# Patient Record
Sex: Female | Born: 1985 | Race: Black or African American | Hispanic: No | Marital: Married | Smoking: Never smoker
Health system: Southern US, Community
[De-identification: ages and names within clinical notes are randomized; demographics above are authoritative.]

## PROBLEM LIST (undated history)

## (undated) ENCOUNTER — Inpatient Hospital Stay (HOSPITAL_COMMUNITY): Payer: Self-pay

## (undated) ENCOUNTER — Ambulatory Visit (HOSPITAL_COMMUNITY): Source: Home / Self Care

## (undated) DIAGNOSIS — D649 Anemia, unspecified: Secondary | ICD-10-CM

## (undated) HISTORY — PX: NO PAST SURGERIES: SHX2092

---
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
## 2005-01-27 ENCOUNTER — Emergency Department (HOSPITAL_COMMUNITY): Admission: EM | Admit: 2005-01-27 | Discharge: 2005-01-28 | Payer: Self-pay | Admitting: Emergency Medicine

## 2005-06-10 ENCOUNTER — Inpatient Hospital Stay (HOSPITAL_COMMUNITY): Admission: AD | Admit: 2005-06-10 | Discharge: 2005-06-10 | Payer: Self-pay | Admitting: Obstetrics and Gynecology

## 2005-08-18 ENCOUNTER — Emergency Department (HOSPITAL_COMMUNITY): Admission: EM | Admit: 2005-08-18 | Discharge: 2005-08-18 | Payer: Self-pay | Admitting: Emergency Medicine

## 2005-09-22 ENCOUNTER — Emergency Department (HOSPITAL_COMMUNITY): Admission: EM | Admit: 2005-09-22 | Discharge: 2005-09-22 | Payer: Self-pay | Admitting: Family Medicine

## 2005-12-19 ENCOUNTER — Emergency Department (HOSPITAL_COMMUNITY): Admission: EM | Admit: 2005-12-19 | Discharge: 2005-12-19 | Payer: Self-pay | Admitting: Emergency Medicine

## 2006-01-17 ENCOUNTER — Emergency Department (HOSPITAL_COMMUNITY): Admission: EM | Admit: 2006-01-17 | Discharge: 2006-01-17 | Payer: Self-pay | Admitting: Emergency Medicine

## 2006-02-23 ENCOUNTER — Emergency Department (HOSPITAL_COMMUNITY): Admission: EM | Admit: 2006-02-23 | Discharge: 2006-02-23 | Payer: Self-pay | Admitting: Family Medicine

## 2006-04-07 ENCOUNTER — Emergency Department (HOSPITAL_COMMUNITY): Admission: EM | Admit: 2006-04-07 | Discharge: 2006-04-07 | Payer: Self-pay | Admitting: Family Medicine

## 2006-05-22 ENCOUNTER — Emergency Department (HOSPITAL_COMMUNITY): Admission: EM | Admit: 2006-05-22 | Discharge: 2006-05-22 | Payer: Self-pay | Admitting: Family Medicine

## 2006-07-11 ENCOUNTER — Emergency Department (HOSPITAL_COMMUNITY): Admission: EM | Admit: 2006-07-11 | Discharge: 2006-07-11 | Payer: Self-pay | Admitting: Family Medicine

## 2006-07-20 ENCOUNTER — Inpatient Hospital Stay (HOSPITAL_COMMUNITY): Admission: AD | Admit: 2006-07-20 | Discharge: 2006-07-20 | Payer: Self-pay | Admitting: Obstetrics and Gynecology

## 2006-08-31 ENCOUNTER — Emergency Department (HOSPITAL_COMMUNITY): Admission: EM | Admit: 2006-08-31 | Discharge: 2006-08-31 | Payer: Self-pay | Admitting: Family Medicine

## 2006-11-28 ENCOUNTER — Emergency Department (HOSPITAL_COMMUNITY): Admission: EM | Admit: 2006-11-28 | Discharge: 2006-11-28 | Payer: Self-pay | Admitting: Emergency Medicine

## 2007-03-14 ENCOUNTER — Emergency Department (HOSPITAL_COMMUNITY): Admission: EM | Admit: 2007-03-14 | Discharge: 2007-03-14 | Payer: Self-pay | Admitting: Family Medicine

## 2007-08-27 ENCOUNTER — Emergency Department (HOSPITAL_COMMUNITY): Admission: EM | Admit: 2007-08-27 | Discharge: 2007-08-27 | Payer: Self-pay | Admitting: Emergency Medicine

## 2007-09-24 ENCOUNTER — Emergency Department (HOSPITAL_COMMUNITY): Admission: EM | Admit: 2007-09-24 | Discharge: 2007-09-24 | Payer: Self-pay | Admitting: Family Medicine

## 2007-11-18 ENCOUNTER — Emergency Department (HOSPITAL_COMMUNITY): Admission: EM | Admit: 2007-11-18 | Discharge: 2007-11-18 | Payer: Self-pay | Admitting: Family Medicine

## 2008-01-06 ENCOUNTER — Emergency Department (HOSPITAL_COMMUNITY): Admission: EM | Admit: 2008-01-06 | Discharge: 2008-01-06 | Payer: Self-pay | Admitting: Emergency Medicine

## 2008-02-15 ENCOUNTER — Inpatient Hospital Stay (HOSPITAL_COMMUNITY): Admission: AD | Admit: 2008-02-15 | Discharge: 2008-02-15 | Payer: Self-pay | Admitting: Obstetrics & Gynecology

## 2008-02-16 ENCOUNTER — Inpatient Hospital Stay (HOSPITAL_COMMUNITY): Admission: AD | Admit: 2008-02-16 | Discharge: 2008-02-16 | Payer: Self-pay | Admitting: Obstetrics & Gynecology

## 2008-02-19 ENCOUNTER — Inpatient Hospital Stay (HOSPITAL_COMMUNITY): Admission: AD | Admit: 2008-02-19 | Discharge: 2008-02-19 | Payer: Self-pay | Admitting: Obstetrics & Gynecology

## 2008-02-22 ENCOUNTER — Inpatient Hospital Stay (HOSPITAL_COMMUNITY): Admission: AD | Admit: 2008-02-22 | Discharge: 2008-02-22 | Payer: Self-pay | Admitting: Obstetrics & Gynecology

## 2008-02-29 ENCOUNTER — Inpatient Hospital Stay (HOSPITAL_COMMUNITY): Admission: AD | Admit: 2008-02-29 | Discharge: 2008-02-29 | Payer: Self-pay | Admitting: Obstetrics & Gynecology

## 2008-03-07 ENCOUNTER — Inpatient Hospital Stay (HOSPITAL_COMMUNITY): Admission: AD | Admit: 2008-03-07 | Discharge: 2008-03-07 | Payer: Self-pay | Admitting: Obstetrics & Gynecology

## 2008-04-10 ENCOUNTER — Emergency Department (HOSPITAL_COMMUNITY): Admission: EM | Admit: 2008-04-10 | Discharge: 2008-04-10 | Payer: Self-pay | Admitting: Emergency Medicine

## 2008-07-04 ENCOUNTER — Inpatient Hospital Stay (HOSPITAL_COMMUNITY): Admission: AD | Admit: 2008-07-04 | Discharge: 2008-07-04 | Payer: Self-pay | Admitting: Obstetrics & Gynecology

## 2008-09-07 ENCOUNTER — Inpatient Hospital Stay (HOSPITAL_COMMUNITY): Admission: AD | Admit: 2008-09-07 | Discharge: 2008-09-08 | Payer: Self-pay | Admitting: Obstetrics & Gynecology

## 2008-09-10 ENCOUNTER — Inpatient Hospital Stay (HOSPITAL_COMMUNITY): Admission: AD | Admit: 2008-09-10 | Discharge: 2008-09-10 | Payer: Self-pay | Admitting: Obstetrics & Gynecology

## 2008-09-15 ENCOUNTER — Inpatient Hospital Stay (HOSPITAL_COMMUNITY): Admission: AD | Admit: 2008-09-15 | Discharge: 2008-09-15 | Payer: Self-pay | Admitting: Obstetrics & Gynecology

## 2008-09-16 ENCOUNTER — Inpatient Hospital Stay (HOSPITAL_COMMUNITY): Admission: AD | Admit: 2008-09-16 | Discharge: 2008-09-17 | Payer: Self-pay | Admitting: Obstetrics and Gynecology

## 2008-09-19 ENCOUNTER — Inpatient Hospital Stay (HOSPITAL_COMMUNITY): Admission: AD | Admit: 2008-09-19 | Discharge: 2008-09-19 | Payer: Self-pay | Admitting: Obstetrics & Gynecology

## 2008-09-24 ENCOUNTER — Inpatient Hospital Stay (HOSPITAL_COMMUNITY): Admission: AD | Admit: 2008-09-24 | Discharge: 2008-09-24 | Payer: Self-pay | Admitting: Obstetrics & Gynecology

## 2008-10-01 ENCOUNTER — Inpatient Hospital Stay (HOSPITAL_COMMUNITY): Admission: AD | Admit: 2008-10-01 | Discharge: 2008-10-01 | Payer: Self-pay | Admitting: Obstetrics & Gynecology

## 2008-12-25 ENCOUNTER — Ambulatory Visit (HOSPITAL_COMMUNITY): Admission: RE | Admit: 2008-12-25 | Discharge: 2008-12-25 | Payer: Self-pay | Admitting: Obstetrics

## 2009-02-06 ENCOUNTER — Inpatient Hospital Stay (HOSPITAL_COMMUNITY): Admission: AD | Admit: 2009-02-06 | Discharge: 2009-02-06 | Payer: Self-pay | Admitting: Obstetrics

## 2009-03-07 ENCOUNTER — Inpatient Hospital Stay (HOSPITAL_COMMUNITY): Admission: AD | Admit: 2009-03-07 | Discharge: 2009-03-07 | Payer: Self-pay | Admitting: Obstetrics

## 2009-03-21 ENCOUNTER — Ambulatory Visit (HOSPITAL_COMMUNITY): Admission: RE | Admit: 2009-03-21 | Discharge: 2009-03-21 | Payer: Self-pay | Admitting: Obstetrics

## 2009-06-01 ENCOUNTER — Ambulatory Visit: Payer: Self-pay | Admitting: Family

## 2009-06-01 ENCOUNTER — Inpatient Hospital Stay (HOSPITAL_COMMUNITY): Admission: AD | Admit: 2009-06-01 | Discharge: 2009-06-01 | Payer: Self-pay | Admitting: Obstetrics

## 2009-08-01 ENCOUNTER — Inpatient Hospital Stay (HOSPITAL_COMMUNITY): Admission: AD | Admit: 2009-08-01 | Discharge: 2009-08-02 | Payer: Self-pay | Admitting: Obstetrics

## 2009-08-04 ENCOUNTER — Inpatient Hospital Stay (HOSPITAL_COMMUNITY): Admission: AD | Admit: 2009-08-04 | Discharge: 2009-08-06 | Payer: Self-pay | Admitting: Obstetrics

## 2009-10-07 ENCOUNTER — Emergency Department (HOSPITAL_COMMUNITY): Admission: EM | Admit: 2009-10-07 | Discharge: 2009-10-07 | Payer: Self-pay | Admitting: Family Medicine

## 2010-01-28 ENCOUNTER — Emergency Department (HOSPITAL_COMMUNITY)
Admission: EM | Admit: 2010-01-28 | Discharge: 2010-01-28 | Payer: Self-pay | Source: Home / Self Care | Admitting: Emergency Medicine

## 2010-03-24 ENCOUNTER — Encounter: Payer: Self-pay | Admitting: Obstetrics

## 2010-05-17 LAB — POCT URINALYSIS DIPSTICK
Bilirubin Urine: NEGATIVE
Glucose, UA: NEGATIVE mg/dL

## 2010-05-17 LAB — GC/CHLAMYDIA PROBE AMP, GENITAL
Chlamydia, DNA Probe: NEGATIVE
GC Probe Amp, Genital: NEGATIVE

## 2010-05-18 LAB — URINALYSIS, ROUTINE W REFLEX MICROSCOPIC
Protein, ur: NEGATIVE mg/dL
Urobilinogen, UA: 0.2 mg/dL (ref 0.0–1.0)

## 2010-05-20 LAB — CBC
Hemoglobin: 11.4 g/dL — ABNORMAL LOW (ref 12.0–15.0)
MCV: 91.2 fL (ref 78.0–100.0)
Platelets: 182 10*3/uL (ref 150–400)
RBC: 3.72 MIL/uL — ABNORMAL LOW (ref 3.87–5.11)
RDW: 12.9 % (ref 11.5–15.5)
WBC: 8.5 10*3/uL (ref 4.0–10.5)

## 2010-05-20 LAB — RPR: RPR Ser Ql: NONREACTIVE

## 2010-06-04 LAB — URINALYSIS, ROUTINE W REFLEX MICROSCOPIC
Hgb urine dipstick: NEGATIVE
Specific Gravity, Urine: 1.025 (ref 1.005–1.030)
Urobilinogen, UA: 0.2 mg/dL (ref 0.0–1.0)

## 2010-06-04 LAB — WET PREP, GENITAL
Trich, Wet Prep: NONE SEEN
Yeast Wet Prep HPF POC: NONE SEEN

## 2010-06-08 LAB — HCG, QUANTITATIVE, PREGNANCY: hCG, Beta Chain, Quant, S: 5 m[IU]/mL — ABNORMAL HIGH (ref <5)

## 2010-06-09 LAB — URINALYSIS, ROUTINE W REFLEX MICROSCOPIC
Glucose, UA: NEGATIVE mg/dL
Ketones, ur: NEGATIVE mg/dL
Nitrite: NEGATIVE
Protein, ur: NEGATIVE mg/dL
Urobilinogen, UA: 0.2 mg/dL (ref 0.0–1.0)

## 2010-06-09 LAB — CREATININE, SERUM
Creatinine, Ser: 0.63 mg/dL (ref 0.4–1.2)
Creatinine, Ser: 0.6 mg/dL (ref 0.4–1.2)
GFR calc Af Amer: >60 mL/min (ref >60)
GFR calc non Af Amer: >60 mL/min (ref >60)

## 2010-06-09 LAB — WET PREP, GENITAL: Trich, Wet Prep: NONE SEEN

## 2010-06-09 LAB — DIFFERENTIAL
Band Neutrophils: 0 % (ref 0–10)
Basophils Absolute: 0 10*3/uL (ref 0.0–0.1)
Basophils Relative: 1 % (ref 0–1)
Eosinophils Relative: 5 % (ref 0–5)
Lymphocytes Relative: 34 % (ref 12–46)
Lymphs Abs: 2 10*3/uL (ref 0.7–4.0)
Monocytes Absolute: 0.5 10*3/uL (ref 0.1–1.0)
Monocytes Relative: 10 % (ref 3–12)
Neutro Abs: 3.3 10*3/uL (ref 1.7–7.7)
Neutro Abs: 2.7 10*3/uL (ref 1.7–7.7)
Neutrophils Relative %: 56 % (ref 43–77)

## 2010-06-09 LAB — HCG, QUANTITATIVE, PREGNANCY
hCG, Beta Chain, Quant, S: 44 m[IU]/mL — ABNORMAL HIGH (ref <5)
hCG, Beta Chain, Quant, S: 472 m[IU]/mL — ABNORMAL HIGH (ref <5)
hCG, Beta Chain, Quant, S: 809 m[IU]/mL — ABNORMAL HIGH (ref <5)
hCG, Beta Chain, Quant, S: 1018 m[IU]/mL — ABNORMAL HIGH (ref <5)

## 2010-06-09 LAB — CBC
Hemoglobin: 10.9 g/dL — ABNORMAL LOW (ref 12.0–15.0)
Hemoglobin: 10.8 g/dL — ABNORMAL LOW (ref 12.0–15.0)
MCHC: 34.1 g/dL (ref 30.0–36.0)
MCHC: 33.9 g/dL (ref 30.0–36.0)
MCHC: 34 g/dL (ref 30.0–36.0)
Platelets: 138 10*3/uL — ABNORMAL LOW (ref 150–400)
RBC: 3.7 MIL/uL — ABNORMAL LOW (ref 3.87–5.11)
RBC: 3.7 MIL/uL — ABNORMAL LOW (ref 3.87–5.11)
RDW: 12.6 % (ref 11.5–15.5)
RDW: 12.8 % (ref 11.5–15.5)
WBC: 5.9 10*3/uL (ref 4.0–10.5)

## 2010-06-09 LAB — ABO/RH: ABO/RH(D): B POS

## 2010-06-09 LAB — AST: AST: 25 U/L (ref 0–37)

## 2010-06-09 LAB — BUN: BUN: 14 mg/dL (ref 6–23)

## 2010-06-11 LAB — URINALYSIS, ROUTINE W REFLEX MICROSCOPIC
Bilirubin Urine: NEGATIVE
Ketones, ur: NEGATIVE mg/dL
Nitrite: NEGATIVE
Protein, ur: NEGATIVE mg/dL
Urobilinogen, UA: 0.2 mg/dL (ref 0.0–1.0)

## 2010-06-11 LAB — WET PREP, GENITAL

## 2010-06-17 LAB — HCG, QUANTITATIVE, PREGNANCY: hCG, Beta Chain, Quant, S: 214 m[IU]/mL — ABNORMAL HIGH (ref <5)

## 2010-07-19 NOTE — H&P (Signed)
[facility]  Patient:    RIKA, DAUGHDRILL Visit Number: [id number] MRN: [id number]          Service Type: OBS Location: [id number] Attending Physician:  Shaune Spittle Dictated by:   Nigel Bridgeman, C.N.M. Admit Date:  08/26/2001                           History and Physical  HISTORY OF PRESENT ILLNESS:   Ms. Jimmey Ralph is a 25 year old gravida 1, para 0, at 39-5/7 weeks, who presents today with uterine contractions every three minutes.  The patient was seen in maternity admissions yesterday and was monitored a good part of the day.  Her cervix at that time was 1-2 with no change after prolonged evaluation.  The patient was sent home from maternity admissions.  She did continue to have contractions during the night and presents today with increasing strength and quality of the contractions.  She did have some bloody show last night as well.  She reports positive fetal movement.  The pregnancy has been remarkable for:  (1) Age 25; (2) questionable dates; (3) anemia; (4) late to care.  PRENATAL LABORATORY DATA:     Blood type is B positive.  Rh antibody negative. VDRL nonreactive.  Rubella titer positive.  Hepatitis B surface antigen negative.  HIV nonreactive.  Sickle cell test negative.  Pap was normal. Glucose challenge was normal.  GC and Chlamydia cultures were negative.  AFP was not done secondary to patient late to care.  Group B strep culture was negative at 36 weeks.  EDC of August 28, 2001, was established by ultrasound at 22 weeks.  HISTORY OF PRESENT PREGNANCY:  The patient entered care thinking she was approximately 13 weeks but felt like she was further along based upon size greater than dates.  She had an ultrasound that put her at approximately 22 weeks on initiation of care.  She had another ultrasound at 30 weeks, which showed normal growth.  This was done secondary to measuring size less than dates.  She was having some  contractions at that time and was sent to maternity admissions for monitoring, terbutaline, and IV fluids.  The rest of her pregnancy was uncomplicated until yesterday, when she began to have prolonged prodromal labor symptoms.  OBSTETRICAL HISTORY:          The patient is a primigravida.  PAST MEDICAL HISTORY:         The patient is a previous condom user but inconsistent use.  She has occasional bacterial infections.  She reports the usual childhood illnesses.  She has a history of anemia.  ALLERGIES:                    The patient has no known medication allergies.  FAMILY HISTORY:               The patients maternal grandmother had some type of heart problems.  The patients mother and sister have hypertension.  Her maternal grandmother has varicosities.  Her maternal grandmother also has diabetes and has kidney disease.  Her sister does use marijuana and alcohol.  GENETIC HISTORY:              Unremarkable.  SOCIAL HISTORY:               The patient is single.  The father of the baby is not involved.  The patient is an  eighth grade student.  She is African-American.  She lives with her mother.  She denies any alcohol, drug, or tobacco use during this pregnancy.  PHYSICAL EXAMINATION:  VITAL SIGNS:                  Stable.  The patient is afebrile.  HEENT:                        Within normal limits.  CHEST:                        Bilateral breath sounds are clear.  CARDIAC:                      Regular rate and rhythm without murmur.  BREASTS:                      Soft and nontender.  ABDOMEN:                      Fundal height is approximately 38 cm.  Estimated fetal weight is 7 to 7-1/2 pounds.  Uterine contractions are every three minutes, moderate to strong quality.  PELVIC:                       Cervical exam 3+, 90%, vertex at a -1 station with bulging bag of water.  Fetal heart rate is in the 150s by Doppler.  EXTREMITIES:                  Deep tendon reflexes  are 2+ without clonus. There is a trace to 1+ edema noted in the lower extremities.  IMPRESSION:                   1. Intrauterine pregnancy at 39-5/7 weeks.                               25. [age]-year-old patient.                               3. Prolonged prodromal labor with now early                                  labor.  PLAN:                         1. Admit to birthing suite per consult with                                  Dr. Dierdre Forth as attending physician.                               2. Routine certified nurse midwife orders.                               3. Reviewed options for pain medication, i.e.,  IV pain medication or epidural, with patient                                  and her mother.  They will decide as labor                                  progresses. Dictated by:   Nigel Bridgeman, C.N.M. Attending Physician:  Shaune Spittle DD:  08/26/01 TD:  08/26/01 Job: [id number]

## 2010-11-20 LAB — POCT URINALYSIS DIP (DEVICE)
Glucose, UA: NEGATIVE
Hgb urine dipstick: NEGATIVE
Nitrite: NEGATIVE
Urobilinogen, UA: 2 — ABNORMAL HIGH
pH: 6.5

## 2010-11-20 LAB — POCT PREGNANCY, URINE
Operator id: 235561
Preg Test, Ur: NEGATIVE

## 2010-11-20 LAB — WET PREP, GENITAL: WBC, Wet Prep HPF POC: NONE SEEN

## 2010-11-28 LAB — POCT URINALYSIS DIP (DEVICE)
Glucose, UA: NEGATIVE
Nitrite: NEGATIVE
Operator id: 239701
Urobilinogen, UA: 0.2

## 2010-11-28 LAB — WET PREP, GENITAL
Trich, Wet Prep: NONE SEEN
Yeast Wet Prep HPF POC: NONE SEEN

## 2010-11-28 LAB — POCT PREGNANCY, URINE: Preg Test, Ur: NEGATIVE

## 2010-11-29 LAB — POCT URINALYSIS DIP (DEVICE)
Glucose, UA: NEGATIVE
Ketones, ur: 15 — AB
Operator id: 247071
Specific Gravity, Urine: >=1.03
Urobilinogen, UA: 0.2

## 2010-11-29 LAB — GC/CHLAMYDIA PROBE AMP, GENITAL
Chlamydia, DNA Probe: NEGATIVE
GC Probe Amp, Genital: NEGATIVE

## 2010-11-29 LAB — WET PREP, GENITAL

## 2010-12-02 LAB — POCT URINALYSIS DIP (DEVICE)
Bilirubin Urine: NEGATIVE
Ketones, ur: 15 — AB
Nitrite: NEGATIVE
Operator id: 239701
Protein, ur: NEGATIVE
pH: 6.5

## 2010-12-02 LAB — WET PREP, GENITAL: Yeast Wet Prep HPF POC: NONE SEEN

## 2010-12-02 LAB — POCT PREGNANCY, URINE: Preg Test, Ur: NEGATIVE

## 2010-12-02 LAB — GC/CHLAMYDIA PROBE AMP, GENITAL: GC Probe Amp, Genital: NEGATIVE

## 2010-12-03 LAB — POCT PREGNANCY, URINE: Preg Test, Ur: NEGATIVE

## 2010-12-03 LAB — POCT URINALYSIS DIP (DEVICE)
Ketones, ur: >=160 mg/dL — AB
Operator id: 239701
Specific Gravity, Urine: 1.025 (ref 1.005–1.030)

## 2010-12-05 LAB — GC/CHLAMYDIA PROBE AMP, GENITAL
Chlamydia, DNA Probe: NEGATIVE
GC Probe Amp, Genital: NEGATIVE

## 2010-12-05 LAB — URINALYSIS, ROUTINE W REFLEX MICROSCOPIC
Glucose, UA: NEGATIVE mg/dL
Nitrite: NEGATIVE
Specific Gravity, Urine: 1.02 (ref 1.005–1.030)
pH: 6 (ref 5.0–8.0)

## 2010-12-05 LAB — CREATININE, SERUM
Creatinine, Ser: 0.63 mg/dL (ref 0.4–1.2)
GFR calc Af Amer: >60 mL/min (ref >60)
GFR calc non Af Amer: >60 mL/min (ref >60)

## 2010-12-05 LAB — DIFFERENTIAL
Basophils Absolute: 0.1 10*3/uL (ref 0.0–0.1)
Basophils Relative: 1 % (ref 0–1)
Eosinophils Absolute: 0.1 10*3/uL (ref 0.0–0.7)
Monocytes Absolute: 0.3 10*3/uL (ref 0.1–1.0)
Neutro Abs: 3.6 10*3/uL (ref 1.7–7.7)
Neutrophils Relative %: 74 % (ref 43–77)

## 2010-12-05 LAB — BUN: BUN: 8 mg/dL (ref 6–23)

## 2010-12-05 LAB — POCT PREGNANCY, URINE: Preg Test, Ur: POSITIVE

## 2010-12-05 LAB — CBC
Hemoglobin: 11.6 g/dL — ABNORMAL LOW (ref 12.0–15.0)
RBC: 3.94 MIL/uL (ref 3.87–5.11)
RDW: 12.6 % (ref 11.5–15.5)
WBC: 4.9 10*3/uL (ref 4.0–10.5)

## 2010-12-05 LAB — WET PREP, GENITAL

## 2010-12-05 LAB — HCG, QUANTITATIVE, PREGNANCY: hCG, Beta Chain, Quant, S: 2362 m[IU]/mL — ABNORMAL HIGH (ref <5)

## 2010-12-05 LAB — AST: AST: 18 U/L (ref 0–37)

## 2010-12-12 LAB — POCT URINALYSIS DIP (DEVICE)
Bilirubin Urine: NEGATIVE
Glucose, UA: NEGATIVE
Hgb urine dipstick: NEGATIVE
Specific Gravity, Urine: 1.02

## 2010-12-12 LAB — POCT PREGNANCY, URINE: Operator id: 239701

## 2010-12-18 LAB — POCT URINALYSIS DIP (DEVICE)
Bilirubin Urine: NEGATIVE
Glucose, UA: NEGATIVE
Hgb urine dipstick: NEGATIVE
Ketones, ur: NEGATIVE
Nitrite: NEGATIVE
Operator id: 239701
Protein, ur: NEGATIVE
Specific Gravity, Urine: 1.025
Urobilinogen, UA: 2 — ABNORMAL HIGH
pH: 7

## 2010-12-18 LAB — POCT PREGNANCY, URINE: Preg Test, Ur: NEGATIVE

## 2010-12-18 LAB — GC/CHLAMYDIA PROBE AMP, GENITAL
Chlamydia, DNA Probe: NEGATIVE
GC Probe Amp, Genital: NEGATIVE

## 2010-12-18 LAB — WET PREP, GENITAL

## 2011-12-17 ENCOUNTER — Encounter (HOSPITAL_COMMUNITY): Payer: Self-pay

## 2011-12-17 ENCOUNTER — Emergency Department (INDEPENDENT_AMBULATORY_CARE_PROVIDER_SITE_OTHER)
Admission: EM | Admit: 2011-12-17 | Discharge: 2011-12-17 | Disposition: A | Discharge status: Home / Self Care | Payer: Self-pay | Source: Home / Self Care

## 2011-12-17 DIAGNOSIS — B86 Scabies: Secondary | ICD-10-CM

## 2011-12-17 MED ORDER — PERMETHRIN 5 % EX CREA: TOPICAL_CREAM | Freq: Once | CUTANEOUS | Status: DC

## 2011-12-17 MED ORDER — PERMETHRIN 5 % EX CREA: TOPICAL_CREAM | CUTANEOUS | Status: DC

## 2011-12-17 NOTE — ED Provider Notes (Signed)
History     CSN: [id number]  Arrival date & time 12/17/11  0843   None     Chief Complaint  Patient presents with  . Rash    (Consider location/radiation/quality/duration/timing/severity/associated sxs/prior treatment) HPI Comments: 26 year old female complains of itchy skin for some weeks. She is unable to tell me just exactly how long period. The papules are located primarily on her hands and feet and to the distal extremities. There are also a few bumps with scratch marks on her torso. She knows her small child has been itching and scratching for several weeks as well. She denies ill feelings, fever, malaise or other symptoms of sickness.   History reviewed. No pertinent past medical history.  History reviewed. No pertinent past surgical history.  No family history on file.  History  Substance Use Topics  . Smoking status: Never Smoker   . Smokeless tobacco: Not on file  . Alcohol Use: No    OB History    Grav Para Term Preterm Abortions TAB SAB Ect Mult Living                  Review of Systems  Constitutional: Negative for fever, chills, activity change and fatigue.  HENT: Negative.   Respiratory: Negative.   Cardiovascular: Negative.   Skin: Positive for rash.  Neurological: Negative.     Allergies  Review of patient's allergies indicates no known allergies.  Home Medications   Current Outpatient Rx  Name Route Sig Dispense Refill  . PERMETHRIN 5 % EX CREA  Apply to affected area once. Repeat in 1 week. 60 g 1    BP 110/71  Pulse 66  Temp 98.2 F (36.8 C) (Oral)  Resp 16  SpO2 100%  LMP 11/22/2011  Physical Exam  Constitutional: She is oriented to person, place, and time. She appears well-developed and well-nourished. No distress.  Eyes: Conjunctivae normal and EOM are normal.  Neck: Normal range of motion. Neck supple.  Pulmonary/Chest: Effort normal.  Musculoskeletal: Normal range of motion. She exhibits no edema and no tenderness.    Neurological: She is alert and oriented to person, place, and time. No cranial nerve deficit.  Skin: Rash noted.  Psychiatric: She has a normal mood and affect.    ED Course  Procedures (including critical care time)  Labs Reviewed - No data to display No results found.   1. Scabies       MDM  Elimite applied from chin to toes and leave on for 8 hours. Then shower off. Repeat in one week. Instructions to clean wound and close and other infested articles at home. Have other members of the family treated, may take child to pediatrician. Dr. Lorenz Coaster requested I write a Rx Elemite q.s. For the entire family.         Hayden Rasmussen, NP 12/17/11 1020  Hayden Rasmussen, NP 12/17/11 1055

## 2011-12-17 NOTE — ED Notes (Signed)
Rash all over body x 1 week

## 2012-01-14 NOTE — ED Provider Notes (Signed)
Medical screening examination/treatment/procedure(s) were performed by non-physician practitioner and as supervising physician I was immediately available for consultation/collaboration.  Leslee Home, M.D.   Reuben Likes, MD 01/14/12 [phone]

## 2012-01-15 ENCOUNTER — Emergency Department (INDEPENDENT_AMBULATORY_CARE_PROVIDER_SITE_OTHER)
Admission: EM | Admit: 2012-01-15 | Discharge: 2012-01-15 | Disposition: A | Discharge status: Home / Self Care | Payer: Self-pay | Source: Home / Self Care | Attending: Family Medicine | Admitting: Family Medicine

## 2012-01-15 ENCOUNTER — Encounter (HOSPITAL_COMMUNITY): Payer: Self-pay | Admitting: *Deleted

## 2012-01-15 DIAGNOSIS — B3731 Acute candidiasis of vulva and vagina: Secondary | ICD-10-CM

## 2012-01-15 DIAGNOSIS — B373 Candidiasis of vulva and vagina: Secondary | ICD-10-CM

## 2012-01-15 MED ORDER — TERCONAZOLE 80 MG VA SUPP: 80.0000 mg | Freq: Every day | VAGINAL | Status: DC

## 2012-01-15 MED ORDER — FLUCONAZOLE 150 MG PO TABS: 150.0000 mg | ORAL_TABLET | Freq: Once | ORAL | Status: DC

## 2012-01-15 NOTE — ED Provider Notes (Signed)
History     CSN: [id number]  Arrival date & time 01/15/12  1907   First MD Initiated Contact with Patient 01/15/12 1910      No chief complaint on file.   (Consider location/radiation/quality/duration/timing/severity/associated sxs/prior treatment) Patient is a 26 y.o. female presenting with vaginal itching.  Vaginal Itching This is a new problem. The current episode started 2 days ago. Pertinent negatives include no abdominal pain.    No past medical history on file.  No past surgical history on file.  No family history on file.  History  Substance Use Topics  . Smoking status: Never Smoker   . Smokeless tobacco: Not on file  . Alcohol Use: No    OB History    Grav Para Term Preterm Abortions TAB SAB Ect Mult Living                  Review of Systems  Constitutional: Negative.   Gastrointestinal: Negative for abdominal pain.  Genitourinary: Positive for vaginal discharge. Negative for dysuria, frequency, vaginal bleeding, vaginal pain, menstrual problem and pelvic pain.    Allergies  Review of patient's allergies indicates no known allergies.  Home Medications   Current Outpatient Rx  Name  Route  Sig  Dispense  Refill  . FLUCONAZOLE 150 MG PO TABS   Oral   Take 1 tablet (150 mg total) by mouth once. Repeat in 1 week.   1 tablet   1   . PERMETHRIN 5 % EX CREA      Apply to affected area once. Repeat in 1 week.   60 g   1   . PERMETHRIN 5 % EX CREA   Topical   Apply topically once. Apply from chin to toes and rinse in 8 hours   240 g   0   . TERCONAZOLE 80 MG VA SUPP   Vaginal   Place 1 suppository (80 mg total) vaginally at bedtime.   3 suppository   0     BP 96/71  Pulse 73  Temp 98.3 F (36.8 C) (Oral)  Resp 16  SpO2 100%  LMP 11/22/2011  Physical Exam  Nursing note and vitals reviewed. Constitutional: She is oriented to person, place, and time. She appears well-developed and well-nourished.  Abdominal: Soft. Bowel sounds are  normal. There is no tenderness. There is no rebound.  Neurological: She is alert and oriented to person, place, and time.  Skin: Skin is warm and dry.    ED Course  Procedures (including critical care time)  Labs Reviewed - No data to display No results found.   1. Yeast vaginitis       MDM          Linna Hoff, MD 01/15/12 2055

## 2012-01-15 NOTE — ED Notes (Signed)
C/o vaginal itching and discharge x 2 days.

## 2013-05-26 ENCOUNTER — Encounter (HOSPITAL_COMMUNITY): Payer: Self-pay | Admitting: Emergency Medicine

## 2013-05-26 ENCOUNTER — Emergency Department (INDEPENDENT_AMBULATORY_CARE_PROVIDER_SITE_OTHER)
Admission: EM | Admit: 2013-05-26 | Discharge: 2013-05-26 | Disposition: A | Discharge status: Home / Self Care | Payer: 59 | Source: Home / Self Care

## 2013-05-26 DIAGNOSIS — L0390 Cellulitis, unspecified: Secondary | ICD-10-CM

## 2013-05-26 DIAGNOSIS — L0291 Cutaneous abscess, unspecified: Secondary | ICD-10-CM

## 2013-05-26 LAB — POCT PREGNANCY, URINE: PREG TEST UR: NEGATIVE

## 2013-05-26 MED ORDER — SULFAMETHOXAZOLE-TMP DS 800-160 MG PO TABS: 1.0000 | ORAL_TABLET | Freq: Two times a day (BID) | ORAL | Status: DC

## 2013-05-26 NOTE — ED Notes (Signed)
Patient reports tattoo on right lower leg was done Monday( 3/23 ).  Reports a friend did this tattoo.  Currently colored area has blisters, redness, outside of tattoo lines has redness.  Patient reports leg is painful and currently working a job that requires her to stand nonstop

## 2013-05-26 NOTE — ED Provider Notes (Signed)
CSN: [id number]     Arrival date & time 05/26/13  1923 History   None    Chief Complaint  Patient presents with  . Cellulitis   (Consider location/radiation/quality/duration/timing/severity/associated sxs/prior Treatment) HPI Comments: 28 yo female comes in 3 days after getting tattoo on right lower extremity with redness/ swelling/ pain. She denies fever. She has been covering area with A & D ointment w/o improvement. She has never had problems with tattoos previously.    History reviewed. No pertinent past medical history. History reviewed. No pertinent past surgical history. No family history on file. History  Substance Use Topics  . Smoking status: Never Smoker   . Smokeless tobacco: Not on file  . Alcohol Use: No   OB History   Grav Para Term Preterm Abortions TAB SAB Ect Mult Living                 Review of Systems  Skin: Positive for color change and wound.  All other systems reviewed and are negative.    Allergies  Review of patient's allergies indicates no known allergies.  Home Medications   Current Outpatient Rx  Name  Route  Sig  Dispense  Refill  . fluconazole (DIFLUCAN) 150 MG tablet   Oral   Take 1 tablet (150 mg total) by mouth once. Repeat in 1 week.   1 tablet   1   . permethrin (ELIMITE) 5 % cream      Apply to affected area once. Repeat in 1 week.   60 g   1   . permethrin (ELIMITE) 5 % cream   Topical   Apply topically once. Apply from chin to toes and rinse in 8 hours   240 g   0   . sulfamethoxazole-trimethoprim (BACTRIM DS) 800-160 MG per tablet   Oral   Take 1 tablet by mouth 2 (two) times daily.   14 tablet   0   . terconazole (TERAZOL 3) 80 MG vaginal suppository   Vaginal   Place 1 suppository (80 mg total) vaginally at bedtime.   3 suppository   0    BP 108/71  Pulse 71  Temp(Src) 98.1 F (36.7 C) (Oral)  Resp 20  SpO2 98%  LMP 05/12/2013 Physical Exam  Nursing note and vitals reviewed. Constitutional: She  appears well-developed and well-nourished.  Skin: Skin is warm and dry. There is erythema.  Right lateral LE with area of erythema encompasses entire tattoo, with yellow crusting/ exudate present and mild edema on tattoo and approximately 1 inch outside of tattoo, mild tenderness    ED Course  Procedures (including critical care time) Labs Review Labs Reviewed  POCT PREGNANCY, URINE   Imaging Review No results found.   MDM   1. Cellulitis   Advised of wound hygiene, elevate leg w/c if SX increase or ER. Bactrim DS AD with food, OOW X 2 days.      Berenice PrimasMelissa R Kenzee Bassin, PA-C 05/26/13 2213

## 2013-05-26 NOTE — Discharge Instructions (Signed)
Cellulitis °Cellulitis is an infection of the skin and the tissue under the skin. The infected area is usually red and tender. This happens most often in the arms and lower legs. °HOME CARE  °· Take your antibiotic medicine as told. Finish the medicine even if you start to feel better. °· Keep the infected arm or leg raised (elevated). °· Put a warm cloth on the area up to 4 times per day. °· Only take medicines as told by your doctor. °· Keep all doctor visits as told. °GET HELP RIGHT AWAY IF:  °· You have a fever. °· You feel very sleepy. °· You throw up (vomit) or have watery poop (diarrhea). °· You feel sick and have muscle aches and pains. °· You see red streaks on the skin coming from the infected area. °· Your red area gets bigger or turns a dark color. °· Your bone or joint under the infected area is painful after the skin heals. °· Your infection comes back in the same area or different area. °· You have a puffy (swollen) bump in the infected area. °· You have new symptoms. °MAKE SURE YOU:  °· Understand these instructions. °· Will watch your condition. °· Will get help right away if you are not doing well or get worse. °Document Released: 08/06/2007 Document Revised: 08/19/2011 Document Reviewed: 05/05/2011 °[employer]® Patient Information ©2014 [employer]. ° °

## 2013-05-26 NOTE — ED Notes (Signed)
Discharge delayed to obtain urine preg test prior to discharge.  Patient in bathroom now

## 2013-05-27 NOTE — ED Provider Notes (Signed)
Medical screening examination/treatment/procedure(s) were performed by a resident physician or non-physician practitioner and as the supervising physician I was immediately available for consultation/collaboration.  Bridgit Eynon, MD   Benard Minturn S Jannae Fagerstrom, MD 05/27/13 0739 

## 2013-05-29 ENCOUNTER — Emergency Department (HOSPITAL_COMMUNITY)
Admission: EM | Admit: 2013-05-29 | Discharge: 2013-05-29 | Disposition: A | Discharge status: Home / Self Care | Payer: 59 | Attending: Emergency Medicine | Admitting: Emergency Medicine

## 2013-05-29 ENCOUNTER — Encounter (HOSPITAL_COMMUNITY): Payer: Self-pay | Admitting: Emergency Medicine

## 2013-05-29 DIAGNOSIS — L03119 Cellulitis of unspecified part of limb: Secondary | ICD-10-CM

## 2013-05-29 DIAGNOSIS — Z792 Long term (current) use of antibiotics: Secondary | ICD-10-CM | POA: Insufficient documentation

## 2013-05-29 DIAGNOSIS — L02419 Cutaneous abscess of limb, unspecified: Secondary | ICD-10-CM | POA: Insufficient documentation

## 2013-05-29 MED ORDER — OXYCODONE-ACETAMINOPHEN 5-325 MG PO TABS: 1.0000 | ORAL_TABLET | ORAL | Status: DC | PRN

## 2013-05-29 MED ORDER — OXYCODONE-ACETAMINOPHEN 5-325 MG PO TABS
1.0000 | ORAL_TABLET | Freq: Once | ORAL | Status: AC
  Administered 2013-05-29: 1 via ORAL
  Filled 2013-05-29: qty 1

## 2013-05-29 MED ORDER — IBUPROFEN 800 MG PO TABS
800.0000 mg | ORAL_TABLET | Freq: Once | ORAL | Status: AC
  Administered 2013-05-29: 800 mg via ORAL
  Filled 2013-05-29: qty 1

## 2013-05-29 NOTE — ED Notes (Signed)
Patient presents with infected tatoo to the right calf

## 2013-05-29 NOTE — ED Provider Notes (Signed)
CSN: [id number]     Arrival date & time 05/29/13  0032 History   First MD Initiated Contact with Patient 05/29/13 0200     Chief Complaint  Patient presents with  . Tatoo Infection      (Consider location/radiation/quality/duration/timing/severity/associated sxs/prior Treatment) The history is provided by the patient.   28 year old female had a tattoo applied to her right calf and she developed an infection following that. She was seen in urgent care 3 days ago and started on trimethoprim-sulfamethoxazole. Since then, and she states the redness has decreased but she continues to have pain in that spot. Pain is 5/10 at rest but will go to 9/10 if she tries to walk on it. She is unable to work because of the pain. She's been taking acetaminophen with no benefit. She denies fever chills or sweats.  History reviewed. No pertinent past medical history. History reviewed. No pertinent past surgical history. History reviewed. No pertinent family history. History  Substance Use Topics  . Smoking status: Never Smoker   . Smokeless tobacco: Never Used  . Alcohol Use: No   OB History   Grav Para Term Preterm Abortions TAB SAB Ect Mult Living                 Review of Systems  All other systems reviewed and are negative.      Allergies  Review of patient's allergies indicates no known allergies.  Home Medications   Current Outpatient Rx  Name  Route  Sig  Dispense  Refill  . sulfamethoxazole-trimethoprim (BACTRIM DS) 800-160 MG per tablet   Oral   Take 1 tablet by mouth 2 (two) times daily. For 7 days          BP 105/66  Pulse 62  Temp(Src) 98 F (36.7 C) (Oral)  Resp 16  Ht 5\' 5"  (1.651 m)  Wt 140 lb (63.504 kg)  BMI 23.30 kg/m2  SpO2 99%  LMP 05/12/2013 Physical Exam  Nursing note and vitals reviewed.  28 year old female, resting comfortably and in no acute distress. Vital signs are normal. Oxygen saturation is 99%, which is normal. Head is normocephalic and  atraumatic. PERRLA, EOMI. Oropharynx is clear. Neck is nontender and supple without adenopathy or JVD. Back is nontender and there is no CVA tenderness. Lungs are clear without rales, wheezes, or rhonchi. Chest is nontender. Heart has regular rate and rhythm without murmur. Abdomen is soft, flat, nontender without masses or hepatosplenomegaly and peristalsis is normoactive. Extremities: There is a tattoo on the lateral aspect of the right calf. There is mild erythema and slight swelling within the tattoo but no lymphangitic streaks and no spread of erythema beyond the tattoo. There is minimal warmth and it is moderately tender to palpation. There is no crepitus.. Skin is warm and dry without other rash. Neurologic: Mental status is normal, cranial nerves are intact, there are no motor or sensory deficits.  ED Course  Procedures (including critical care time)  MDM   Final diagnoses:  Cellulitis of leg without foot    Infected tattoo. Old records are reviewed and she was seen in urgent care 3 days ago and started on sulfamethoxazole-trimethoprim. Her main concern today is a pain. It seems that the infection is responding appropriately to antibiotics that she does not need to be switched to a different antibiotic. She is advised to take over-the-counter naproxen and she is also given prescription for Percocet. She's given a work release for the next 2 days.  Dione Booze, MD 05/29/13 [phone]

## 2013-05-29 NOTE — Discharge Instructions (Signed)
Continue taking your antibiotic until it is completely gone. Take naproxen (Aleve) - two tablets at a time, twice a day.  Acetaminophen; Oxycodone tablets What is this medicine? ACETAMINOPHEN; OXYCODONE (a set a MEE noe fen; ox i KOE done) is a pain reliever. It is used to treat mild to moderate pain. This medicine may be used for other purposes; ask your health care provider or pharmacist if you have questions. COMMON BRAND NAME(S): Endocet, Magnacet, Narvox, Percocet, Perloxx, Primalev, Primlev, Roxicet, Xolox What should I tell my health care provider before I take this medicine? They need to know if you have any of these conditions: -brain tumor -Crohn's disease, inflammatory bowel disease, or ulcerative colitis -drug abuse or addiction -head injury -heart or circulation problems -if you often drink alcohol -kidney disease or problems going to the bathroom -liver disease -lung disease, asthma, or breathing problems -an unusual or allergic reaction to acetaminophen, oxycodone, other opioid analgesics, other medicines, foods, dyes, or preservatives -pregnant or trying to get pregnant -breast-feeding How should I use this medicine? Take this medicine by mouth with a full glass of water. Follow the directions on the prescription label. Take your medicine at regular intervals. Do not take your medicine more often than directed. Talk to your pediatrician regarding the use of this medicine in children. Special care may be needed. Patients over 29 years old may have a stronger reaction and need a smaller dose. Overdosage: If you think you have taken too much of this medicine contact a poison control center or emergency room at once. NOTE: This medicine is only for you. Do not share this medicine with others. What if I miss a dose? If you miss a dose, take it as soon as you can. If it is almost time for your next dose, take only that dose. Do not take double or extra doses. What may interact  with this medicine? -alcohol -antihistamines -barbiturates like amobarbital, butalbital, butabarbital, methohexital, pentobarbital, phenobarbital, thiopental, and secobarbital -benztropine -drugs for bladder problems like solifenacin, trospium, oxybutynin, tolterodine, hyoscyamine, and methscopolamine -drugs for breathing problems like ipratropium and tiotropium -drugs for certain stomach or intestine problems like propantheline, homatropine methylbromide, glycopyrrolate, atropine, belladonna, and dicyclomine -general anesthetics like etomidate, ketamine, nitrous oxide, propofol, desflurane, enflurane, halothane, isoflurane, and sevoflurane -medicines for depression, anxiety, or psychotic disturbances -medicines for sleep -muscle relaxants -naltrexone -narcotic medicines (opiates) for pain -phenothiazines like perphenazine, thioridazine, chlorpromazine, mesoridazine, fluphenazine, prochlorperazine, promazine, and trifluoperazine -scopolamine -tramadol -trihexyphenidyl This list may not describe all possible interactions. Give your health care provider a list of all the medicines, herbs, non-prescription drugs, or dietary supplements you use. Also tell them if you smoke, drink alcohol, or use illegal drugs. Some items may interact with your medicine. What should I watch for while using this medicine? Tell your doctor or health care professional if your pain does not go away, if it gets worse, or if you have new or a different type of pain. You may develop tolerance to the medicine. Tolerance means that you will need a higher dose of the medication for pain relief. Tolerance is normal and is expected if you take this medicine for a long time. Do not suddenly stop taking your medicine because you may develop a severe reaction. Your body becomes used to the medicine. This does NOT mean you are addicted. Addiction is a behavior related to getting and using a drug for a non-medical reason. If you have  pain, you have a medical reason to take pain medicine.  Your doctor will tell you how much medicine to take. If your doctor wants you to stop the medicine, the dose will be slowly lowered over time to avoid any side effects. You may get drowsy or dizzy. Do not drive, use machinery, or do anything that needs mental alertness until you know how this medicine affects you. Do not stand or sit up quickly, especially if you are an older patient. This reduces the risk of dizzy or fainting spells. Alcohol may interfere with the effect of this medicine. Avoid alcoholic drinks. There are different types of narcotic medicines (opiates) for pain. If you take more than one type at the same time, you may have more side effects. Give your health care provider a list of all medicines you use. Your doctor will tell you how much medicine to take. Do not take more medicine than directed. Call emergency for help if you have problems breathing. The medicine will cause constipation. Try to have a bowel movement at least every 2 to 3 days. If you do not have a bowel movement for 3 days, call your doctor or health care professional. Do not take Tylenol (acetaminophen) or medicines that have acetaminophen with this medicine. Too much acetaminophen can be very dangerous. Many nonprescription medicines contain acetaminophen. Always read the labels carefully to avoid taking more acetaminophen. What side effects may I notice from receiving this medicine? Side effects that you should report to your doctor or health care professional as soon as possible: -allergic reactions like skin rash, itching or hives, swelling of the face, lips, or tongue -breathing difficulties, wheezing -confusion -light headedness or fainting spells -severe stomach pain -unusually weak or tired -yellowing of the skin or the whites of the eyes  Side effects that usually do not require medical attention (report to your doctor or health care professional if  they continue or are bothersome): -dizziness -drowsiness -nausea -vomiting This list may not describe all possible side effects. Call your doctor for medical advice about side effects. You may report side effects to FDA at 1-800-FDA-1088. Where should I keep my medicine? Keep out of the reach of children. This medicine can be abused. Keep your medicine in a safe place to protect it from theft. Do not share this medicine with anyone. Selling or giving away this medicine is dangerous and against the law. Store at room temperature between 20 and 25 degrees C (68 and 77 degrees F). Keep container tightly closed. Protect from light. This medicine may cause accidental overdose and death if it is taken by other adults, children, or pets. Flush any unused medicine down the toilet to reduce the chance of harm. Do not use the medicine after the expiration date. NOTE: This sheet is a summary. It may not cover all possible information. If you have questions about this medicine, talk to your doctor, pharmacist, or health care provider.  2014, Elsevier/Gold Standard. (2012-10-11 13:17:35)   Emergency Department Resource Guide 1) Find a Doctor and Pay Out of Pocket Although you won't have to find out who is covered by your insurance plan, it is a good idea to ask around and get recommendations. You will then need to call the office and see if the doctor you have chosen will accept you as a new patient and what types of options they offer for patients who are self-pay. Some doctors offer discounts or will set up payment plans for their patients who do not have insurance, but you will need to ask so you  aren't surprised when you get to your appointment.  2) Contact Your Local Health Department Not all health departments have doctors that can see patients for sick visits, but many do, so it is worth a call to see if yours does. If you don't know where your local health department is, you can check in your phone  book. The CDC also has a tool to help you locate your state's health department, and many state websites also have listings of all of their local health departments.  3) Find a Walk-in Clinic If your illness is not likely to be very severe or complicated, you may want to try a walk in clinic. These are popping up all over the country in pharmacies, drugstores, and shopping centers. They're usually staffed by nurse practitioners or physician assistants that have been trained to treat common illnesses and complaints. They're usually fairly quick and inexpensive. However, if you have serious medical issues or chronic medical problems, these are probably not your best option.  No Primary Care Doctor: - Call Health Connect at  [phone] - they can help you locate a primary care doctor that  accepts your insurance, provides certain services, etc. - Physician Referral Service- [phone]  Chronic Pain Problems: Organization         Address  Phone   Notes  [facility]  [phone] Patients need to be referred by their primary care doctor.   Medication Assistance: Organization         Address  Phone   Notes  [facility] Medication [facility] [address] [place] [place], [place] [address] [phone] --Must be a resident of [facility] -- Must have NO insurance coverage whatsoever (no Medicaid/ Medicare, etc.) -- The pt. MUST have a primary care doctor that directs their care regularly and follows them in the community   Med[facility]ist  [phone]   [employer]  [phone]    Agencies that provide inexpensive medical care: Organization         Address  Phone   Notes  [facility]  [phone]   [facility]    [phone]   [facility] [address] [place], [place] [address] [phone]   [facility] [address] [place], [place] [phone]   Planned  Parenthood    [phone]   [facility]    [phone]   [facility]  [address], [place] Phone:  [phone], Fax:  [phone] Hours of Operation:  9 am - 6 pm, M-F.  Also accepts Medicaid/Medicare and self-pay.  [facility]  [address], [place] Phone: [phone], Fax: [phone]. Hours of Operation:  8:30 am - 5:30 pm, M-F.  Also accepts Medicaid and self-pay.  [facility] [place] [address], [place] Phone: [phone]   [facility] [address] Natasha Bence [place], [place] [phone], Ext. [phone] Mondays & Thursdays: 7-9 AM.  First 15 patients are seen on a first come, first serve basis.    Medicaid-accepting [facility] Providers:  Organization         Address  Phone   Notes  [facility] [address], [place] [phone] Also accepts self-pay patients.  [facility] [address] Laurell Josephs [place], [place]  [phone]  [facility] [address], [place] [phone]   [facility] [address] [place] [phone]   Renaye Rakers [address], [place]   [phone] Only accepts [place] Access [place] patients after they have their name applied to their card.   Self-Pay (no insurance) in [facility]:  Organization         Address  Phone   Notes  Sickle Cell Patients, [facility] Internal Medicine [address] [place], [place] [phone]   [facility]gent Care [address] [place], [place] [phone]   [facility] [place]  [address], [place] [phone]   [facility]/Dr. Osei-Bonsu  [address], [place] or [address], [place] [phone] Phone number for both [place] and [place] locations is the same.    Urgent Medical and [facility] [address] [place] [phone]   [facility] [address] [place] or [address] [phone]   [facility] [address] [place] [phone], phone; [phone], fax Sees patients 1st and 3rd Saturday of every month.  Must not qualify for public or private insurance (i.e. Medicaid, Medicare, [place] Health Choice, Veterans' Benefits)  Household income should be no more than 200% of the poverty level The clinic cannot treat you if you are pregnant or think you are pregnant  Sexually transmitted diseases are not treated at the clinic.    Dental Care: Organization         Address  Phone  Notes  [facility] Department of [facility] [address] [place], [place] [phone] Accepts children up to age 30 who are enrolled in [place] or [place] Health Choice; pregnant women with a Medicaid card; and children who have applied for Medicaid or [place] Health Choice, but were declined, whose parents can pay a reduced fee at time of service.  [facility] Department of [facility]  [address], [place] [phone] Accepts children up to age 51 who are enrolled in [place] or [place] Health Choice; pregnant women with a Medicaid card; and children who have applied for Medicaid or [place] Health Choice, but were declined, whose parents can pay a reduced fee at time of service.  [facility]  [address] [place], [place] [phone] Patients are seen by appointment only. Walk-ins are not accepted. [facility] will see patients 58 years of age and older. Monday - Tuesday (8am-5pm) Most Wednesdays (8:30-5pm) $30 per visit, cash only  [facility] Adult [facility] Access [facility]  [address], [facility] [phone] Patients are seen by appointment only. Walk-ins are not accepted. [facility] will see patients 7  years of age and older. One Wednesday Evening (Monthly: Volunteer Based).  $30 per visit, cash only  [employer]  [phone] for adults; Children under age 82, call Graduate Pediatric Dentistry at [phone]. Children aged 37-14, please call [phone] to request a pediatric application.  Dental services are provided in all areas of dental care including fillings, crowns and bridges, complete and partial dentures, implants, gum treatment, root canals, and extractions. Preventive care is also provided. Treatment is provided to both adults and children. Patients are selected via a lottery and there is often a waiting  list.   [facility] [address], [place]  [phone] [email or web address]   [facility] [address] [place], [place] [phone], Ext. [phone] Second and Fourth Thursday of each month, opens at 6:30 AM; Clinic ends at 9 AM.  Patients are seen on a first-come first-served basis, and a limited number are seen during each clinic.   [facility]  [address] Ether [place], [place] [phone]   Eligibility Requirements You must have lived in [place], [place], or [place] counties for at least the last three months.   You cannot be eligible for state or federal sponsored [place], including [employer], [place], or [employer].   You generally cannot be eligible for healthcare insurance through your employer.    How to apply: Eligibility screenings are held every Tuesday and Wednesday afternoon from 1:00 pm until 4:00 pm. You do not need an appointment for the interview!  [facility] [address] [place], [place] [phone]   [facility] Health Department  [phone]   [facility] Health Department  [phone]   [facility] Health Department  [phone]    Behavioral Health Resources in the Community: Intensive Outpatient  Programs Organization         Address  Phone  Notes  [facility] Services [address], [place], [place] [phone]   [facility] [address], [place], [place] [phone]   [facility] [address], [place], [place]  [phone]   [facility] Mental Health [address],  [place], [place] [phone] or [phone]   Substance Abuse Resources Organization         Address  Phone  Notes  [facility]  [phone]   [facility]  [phone]   [facility] [place]  [phone]   Floydene Flock  [phone]   Residential & Outpatient Substance Abuse Program  [phone]   Psychological Services Organization         Address  Phone  Notes  [facility]  [phone]   [facility]  [phone]   [facility] [address], [place] [phone] or [phone]    Mobile Crisis Teams Organization         Address  Phone  Notes  [facility], Mobile Crisis Care Unit  [phone]   [facility]  [address] [place], [place] [phone]   Doristine Locks [address] [place] [phone]    Self-Help/Support Groups Organization         Address  Phone             Notes  [facility] [place] - variety of support groups  [phone]- [phone] Call for more information  Narcotics Anonymous (NA), [facility] [address], [employer] [place]  2 meetings at this location   Statistician         Address  Phone  Notes  [facility] [address],    [place]  [phone]   [facility]  [address] [place] [address], [place], [place] [phone]   [facility] [address] [place], [place] [phone] Admissions: 8am-3pm M-F  [facility] [address]    [place], [place]  [phone]   [facility] [address] [place], [place], [place] [phone]   [facility] [address]  [place], [place] [phone]   [facility] - Intensive Outpatient [address], [place], [place] [phone]   [facility] ([facility] [address]  [place], [place] [phone] or [phone]   [facility] ([facility]) [address] [place], [place] [phone] Accepts Medicaid  [facility] [address]  [place] [phone] Substance Abuse/Addiction Treatment   [facility] Behavioral Health Resources Organization         Address  Phone  Notes  [facility]  [phone]) [phone]   Angie FavaJulie Brannon, PhD [address] [place], [place]   [phone] or [phone]) [phone]   [facility]   [address] [place], [place] [phone]) [phone]   [facility] [address], [place], [place] [phone]) [phone] Insurance/Medicaid/sponsorship through [facility]  [facility] Families [address]                                    [place], [place] [phone] Therapy/tele-psych/case  [facility] [address].   [place], [place] [phone]    Dr. Lolly MustacheArfeen  [phone]) [phone]   [facility] [place]  [employer] [facility] 1) [address],  2) [address], [place] 3)  [address], [place] [phone]) [phone]  [phone](336) [phone]   [facility] Child Abuse Hotline [phone]) [phone] or [phone](336) [phone] (After Hours)

## 2013-07-13 ENCOUNTER — Encounter (HOSPITAL_COMMUNITY): Payer: Self-pay | Admitting: Emergency Medicine

## 2013-07-13 ENCOUNTER — Emergency Department (INDEPENDENT_AMBULATORY_CARE_PROVIDER_SITE_OTHER)
Admission: EM | Admit: 2013-07-13 | Discharge: 2013-07-13 | Disposition: A | Discharge status: Home / Self Care | Payer: Self-pay | Source: Home / Self Care

## 2013-07-13 DIAGNOSIS — N390 Urinary tract infection, site not specified: Secondary | ICD-10-CM

## 2013-07-13 LAB — POCT URINALYSIS DIP (DEVICE)
Bilirubin Urine: NEGATIVE
GLUCOSE, UA: NEGATIVE mg/dL
Ketones, ur: NEGATIVE mg/dL
NITRITE: NEGATIVE
PROTEIN: 100 mg/dL — AB
SPECIFIC GRAVITY, URINE: 1.025 (ref 1.005–1.030)
UROBILINOGEN UA: 0.2 mg/dL (ref 0.0–1.0)
pH: 7 (ref 5.0–8.0)

## 2013-07-13 LAB — POCT PREGNANCY, URINE: PREG TEST UR: NEGATIVE

## 2013-07-13 MED ORDER — CEPHALEXIN 500 MG PO CAPS: 500.0000 mg | ORAL_CAPSULE | Freq: Four times a day (QID) | ORAL | Status: DC

## 2013-07-13 MED ORDER — FLUCONAZOLE 150 MG PO TABS: ORAL_TABLET | ORAL | Status: DC

## 2013-07-13 NOTE — ED Notes (Signed)
Patient has uti.  History of the same.  Urgency, frequency present.  Has been drinking cranberry juice.

## 2013-07-13 NOTE — ED Notes (Signed)
Urine specimen was obtain while patient was still in the waiting room.

## 2013-07-13 NOTE — ED Provider Notes (Signed)
CSN: [id number]     Arrival date & time 07/13/13  1553 History   First MD Initiated Contact with Patient 07/13/13 1711     Chief Complaint  Patient presents with  . Urinary Tract Infection   (Consider location/radiation/quality/duration/timing/severity/associated sxs/prior Treatment) HPI Comments: C/O dysuria and urgency since yesterday. No fever   History reviewed. No pertinent past medical history. History reviewed. No pertinent past surgical history. No family history on file. History  Substance Use Topics  . Smoking status: Never Smoker   . Smokeless tobacco: Never Used  . Alcohol Use: No   OB History   Grav Para Term Preterm Abortions TAB SAB Ect Mult Living                 Review of Systems  Constitutional: Negative.   Respiratory: Negative.   Gastrointestinal: Negative.   Genitourinary: Positive for dysuria and urgency.  Musculoskeletal: Negative.     Allergies  Review of patient's allergies indicates no known allergies.  Home Medications   Prior to Admission medications   Medication Sig Start Date End Date Taking? Authorizing Provider  oxyCODONE-acetaminophen (PERCOCET) 5-325 MG per tablet Take 1 tablet by mouth every 4 (four) hours as needed. 05/29/13   Dione Boozeavid Glick, MD  sulfamethoxazole-trimethoprim (BACTRIM DS) 800-160 MG per tablet Take 1 tablet by mouth 2 (two) times daily. For 7 days 05/26/13   Berenice PrimasMelissa R Smith, PA-C   BP 122/84  Pulse 64  Temp(Src) 97.5 F (36.4 C) (Oral)  Resp 16  SpO2 100% Physical Exam  Nursing note and vitals reviewed. Constitutional: She is oriented to person, place, and time. She appears well-developed and well-nourished. No distress.  Neck: Normal range of motion.  Cardiovascular: Normal rate.   Pulmonary/Chest: Effort normal. No respiratory distress.  Neurological: She is alert and oriented to person, place, and time.  Skin: Skin is warm and dry.  Psychiatric: She has a normal mood and affect.    ED Course  Procedures  (including critical care time) Labs Review Labs Reviewed  POCT URINALYSIS DIP (DEVICE) - Abnormal; Notable for the following:    Hgb urine dipstick SMALL (*)    Protein, ur 100 (*)    Leukocytes, UA TRACE (*)    All other components within normal limits  URINE CULTURE    Imaging Review No results found.   MDM   1. UTI (lower urinary tract infection)     Kefelx Urine cult pending Plenty of fluids AZO for pain.    Hayden Rasmussenavid Pricilla Moehle, NP 07/13/13 [phone]

## 2013-07-13 NOTE — Discharge Instructions (Signed)
Urinary Tract Infection  Urinary tract infections (UTIs) can develop anywhere along your urinary tract. Your urinary tract is your body's drainage system for removing wastes and extra water. Your urinary tract includes two kidneys, two ureters, a bladder, and a urethra. Your kidneys are a pair of bean-shaped organs. Each kidney is about the size of your fist. They are located below your ribs, one on each side of your spine.  CAUSES  Infections are caused by microbes, which are microscopic organisms, including fungi, viruses, and bacteria. These organisms are so small that they can only be seen through a microscope. Bacteria are the microbes that most commonly cause UTIs.  SYMPTOMS   Symptoms of UTIs may vary by age and gender of the patient and by the location of the infection. Symptoms in young women typically include a frequent and intense urge to urinate and a painful, burning feeling in the bladder or urethra during urination. Older women and men are more likely to be tired, shaky, and weak and have muscle aches and abdominal pain. A fever may mean the infection is in your kidneys. Other symptoms of a kidney infection include pain in your back or sides below the ribs, nausea, and vomiting.  DIAGNOSIS  To diagnose a UTI, your caregiver will ask you about your symptoms. Your caregiver also will ask to provide a urine sample. The urine sample will be tested for bacteria and white blood cells. White blood cells are made by your body to help fight infection.  TREATMENT   Typically, UTIs can be treated with medication. Because most UTIs are caused by a bacterial infection, they usually can be treated with the use of antibiotics. The choice of antibiotic and length of treatment depend on your symptoms and the type of bacteria causing your infection.  HOME CARE INSTRUCTIONS   If you were prescribed antibiotics, take them exactly as your caregiver instructs you. Finish the medication even if you feel better after you  have only taken some of the medication.   Drink enough water and fluids to keep your urine clear or pale yellow.   Avoid caffeine, tea, and carbonated beverages. They tend to irritate your bladder.   Empty your bladder often. Avoid holding urine for long periods of time.   Empty your bladder before and after sexual intercourse.   After a bowel movement, women should cleanse from front to back. Use each tissue only once.  SEEK MEDICAL CARE IF:    You have back pain.   You develop a fever.   Your symptoms do not begin to resolve within 3 days.  SEEK IMMEDIATE MEDICAL CARE IF:    You have severe back pain or lower abdominal pain.   You develop chills.   You have nausea or vomiting.   You have continued burning or discomfort with urination.  MAKE SURE YOU:    Understand these instructions.   Will watch your condition.   Will get help right away if you are not doing well or get worse.  Document Released: 11/27/2004 Document Revised: 08/19/2011 Document Reviewed: 03/28/2011  [employer] Patient Information 2014 [employer].

## 2013-07-15 LAB — URINE CULTURE: SPECIAL REQUESTS: NORMAL

## 2013-07-16 NOTE — ED Provider Notes (Signed)
Medical screening examination/treatment/procedure(s) were performed by non-physician practitioner and as supervising physician I was immediately available for consultation/collaboration.  Leslee Homeavid Bahja Bence, M.D.  Reuben Likesavid C Payson Evrard, MD 07/16/13 [phone]

## 2013-07-18 NOTE — ED Notes (Signed)
Urine culture: >100,000 colonies E. Coli.  Pt. adequately treated with Keflex. [name]18/[facility]

## 2013-09-18 ENCOUNTER — Encounter (HOSPITAL_COMMUNITY): Payer: Self-pay | Admitting: Emergency Medicine

## 2013-09-18 ENCOUNTER — Emergency Department (HOSPITAL_COMMUNITY)
Admission: EM | Admit: 2013-09-18 | Discharge: 2013-09-18 | Disposition: A | Discharge status: Home / Self Care | Payer: 59 | Attending: Emergency Medicine | Admitting: Emergency Medicine

## 2013-09-18 DIAGNOSIS — Z79899 Other long term (current) drug therapy: Secondary | ICD-10-CM | POA: Insufficient documentation

## 2013-09-18 DIAGNOSIS — Z3202 Encounter for pregnancy test, result negative: Secondary | ICD-10-CM | POA: Insufficient documentation

## 2013-09-18 DIAGNOSIS — A499 Bacterial infection, unspecified: Secondary | ICD-10-CM | POA: Insufficient documentation

## 2013-09-18 DIAGNOSIS — N898 Other specified noninflammatory disorders of vagina: Secondary | ICD-10-CM | POA: Insufficient documentation

## 2013-09-18 DIAGNOSIS — R109 Unspecified abdominal pain: Secondary | ICD-10-CM | POA: Insufficient documentation

## 2013-09-18 DIAGNOSIS — N390 Urinary tract infection, site not specified: Secondary | ICD-10-CM

## 2013-09-18 DIAGNOSIS — B9689 Other specified bacterial agents as the cause of diseases classified elsewhere: Secondary | ICD-10-CM | POA: Insufficient documentation

## 2013-09-18 DIAGNOSIS — R300 Dysuria: Secondary | ICD-10-CM | POA: Insufficient documentation

## 2013-09-18 DIAGNOSIS — N760 Acute vaginitis: Secondary | ICD-10-CM | POA: Insufficient documentation

## 2013-09-18 DIAGNOSIS — Z792 Long term (current) use of antibiotics: Secondary | ICD-10-CM | POA: Insufficient documentation

## 2013-09-18 LAB — URINALYSIS, ROUTINE W REFLEX MICROSCOPIC
Bilirubin Urine: NEGATIVE
Glucose, UA: NEGATIVE mg/dL
Ketones, ur: NEGATIVE mg/dL
NITRITE: NEGATIVE
PH: 6 (ref 5.0–8.0)
Protein, ur: 30 mg/dL — AB
SPECIFIC GRAVITY, URINE: 1.023 (ref 1.005–1.030)
UROBILINOGEN UA: 0.2 mg/dL (ref 0.0–1.0)

## 2013-09-18 LAB — URINE MICROSCOPIC-ADD ON

## 2013-09-18 LAB — POC URINE PREG, ED: PREG TEST UR: NEGATIVE

## 2013-09-18 LAB — WET PREP, GENITAL
TRICH WET PREP: NONE SEEN
Yeast Wet Prep HPF POC: NONE SEEN

## 2013-09-18 MED ORDER — KETOROLAC TROMETHAMINE 30 MG/ML IJ SOLN
60.0000 mg | Freq: Once | INTRAMUSCULAR | Status: AC
  Administered 2013-09-18: 60 mg via INTRAMUSCULAR
  Filled 2013-09-18: qty 2

## 2013-09-18 MED ORDER — METRONIDAZOLE 0.75 % VA GEL: 1.0000 | Freq: Every day | VAGINAL | Status: DC

## 2013-09-18 MED ORDER — SULFAMETHOXAZOLE-TMP DS 800-160 MG PO TABS: 1.0000 | ORAL_TABLET | Freq: Two times a day (BID) | ORAL | Status: DC

## 2013-09-18 MED ORDER — OXYCODONE-ACETAMINOPHEN 5-325 MG PO TABS: 1.0000 | ORAL_TABLET | Freq: Four times a day (QID) | ORAL | Status: DC | PRN

## 2013-09-18 MED ORDER — PHENAZOPYRIDINE HCL 200 MG PO TABS: 200.0000 mg | ORAL_TABLET | Freq: Three times a day (TID) | ORAL | Status: DC

## 2013-09-18 NOTE — ED Provider Notes (Signed)
CSN: [id number]     Arrival date & time 09/18/13  0758 History   First MD Initiated Contact with Patient 09/18/13 [phone]     Chief Complaint  Patient presents with  . Urinary Frequency     (Consider location/radiation/quality/duration/timing/severity/associated sxs/prior Treatment) HPI Comments: Stephanie [name] is a 28 y.o. Female with a PMHx of frequent recurrent UTIs presenting today with increased urinary frequency and urgency with an irritating lower abd pain that began at 6AM (3 hrs PTA). Pt states that this feels like her past UTIs. Describes abd pain as "irritating", constant, non-radiating, 7/10, with no known aggravating or alleviating factors. Has not tried anything for it. States she was sexually active last night, with one partner, unprotected, and did not douche or urinate after sex. States she has noticed some vaginal discharge for "a while", which is whitish clear and has no odor. Denies vaginal itching. States she is sexually active with one partner, and always unprotected. Unsure of LMP but denies possibility of pregnancy. Denies fevers, chills, HA, CP, SOB, n/v/d/c, rash, back pain, myalgias or arthralgias. Denies hematuria but states her urine is darker and slightly malodorous.  Patient is a 28 y.o. female presenting with frequency. The history is provided by the patient. No language interpreter was used.  Urinary Frequency This is a recurrent problem. The current episode started today. The problem occurs intermittently. The problem has been unchanged. Associated symptoms include abdominal pain (suprapubic) and urinary symptoms. Pertinent negatives include no arthralgias, change in bowel habit, chest pain, chills, fever, headaches, myalgias, nausea, numbness, rash, vomiting or weakness. Nothing aggravates the symptoms. She has tried nothing for the symptoms.    History reviewed. No pertinent past medical history. History reviewed. No pertinent past surgical history. History  reviewed. No pertinent family history. History  Substance Use Topics  . Smoking status: Never Smoker   . Smokeless tobacco: Never Used  . Alcohol Use: No   OB History   Grav Para Term Preterm Abortions TAB SAB Ect Mult Living                 Review of Systems  Constitutional: Negative for fever and chills.  Respiratory: Negative for shortness of breath.   Cardiovascular: Negative for chest pain.  Gastrointestinal: Positive for abdominal pain (suprapubic). Negative for nausea, vomiting, diarrhea, constipation, blood in stool, rectal pain and change in bowel habit.  Genitourinary: Positive for dysuria, urgency, frequency, vaginal discharge and difficulty urinating. Negative for hematuria, flank pain, vaginal bleeding, vaginal pain, pelvic pain and dyspareunia.  Musculoskeletal: Negative for arthralgias, back pain and myalgias.  Skin: Negative for rash.  Neurological: Negative for dizziness, weakness, numbness and headaches.  Psychiatric/Behavioral: Negative for confusion.  10 Systems reviewed and are negative for acute change except as noted in the HPI.     Allergies  Review of patient's allergies indicates no known allergies.  Home Medications   Prior to Admission medications   Medication Sig Start Date End Date Taking? Authorizing Provider  metroNIDAZOLE (METROGEL VAGINAL) 0.75 % vaginal gel Place 1 Applicatorful vaginally at bedtime. 09/18/13   [name], PA-C  oxyCODONE-acetaminophen (PERCOCET) 5-325 MG per tablet Take 1-2 tablets by mouth every 6 (six) hours as needed for severe pain. 09/18/13   [name], PA-C  phenazopyridine (PYRIDIUM) 200 MG tablet Take 1 tablet (200 mg total) by mouth 3 (three) times daily with meals. As needed for your painful urination 09/18/13   [name], PA-C  sulfamethoxazole-trimethoprim (BACTRIM DS) 800-160 MG per  tablet Take 1 tablet by mouth 2 (two) times daily. 09/18/13   [name] Strupp  Camprubi-Soms, PA-C   BP 113/80  Pulse 63  Temp(Src) 97.8 F (36.6 C) (Oral)  Resp 16  Ht 5\' 1"  (1.549 m)  Wt 140 lb (63.504 kg)  BMI 26.47 kg/m2  SpO2 100%  LMP 09/16/2013 Physical Exam  Nursing note and vitals reviewed. Constitutional: She is oriented to person, place, and time. Vital signs are normal. She appears well-developed and well-nourished. No distress.  Afebrile, NAD  HENT:  Head: Normocephalic and atraumatic.  Mouth/Throat: Mucous membranes are normal.  Eyes: Conjunctivae and EOM are normal. Right eye exhibits no discharge. Left eye exhibits no discharge.  Neck: Normal range of motion. Neck supple.  Cardiovascular: Normal rate, regular rhythm, normal heart sounds and intact distal pulses.   No murmur heard. Pulmonary/Chest: Effort normal and breath sounds normal. She has no wheezes. She has no rales.  Abdominal: Soft. Normal appearance and bowel sounds are normal. She exhibits no distension. There is tenderness in the suprapubic area. There is no rigidity, no rebound, no guarding, no CVA tenderness, no tenderness at McBurney's point and negative Murphy's sign.    Soft, nondistended, +BS throughout. TTP in suprapubic region, no r/g/r, neg murphys, neg mcburneys, no CVA TTP  Genitourinary: Pelvic exam was performed with patient supine. There is no rash, tenderness, lesion or injury on the right labia. There is no rash, tenderness, lesion or injury on the left labia. Uterus is deviated. Uterus is not enlarged, not fixed and not tender. Cervix exhibits discharge. Cervix exhibits no motion tenderness and no friability. Right adnexum displays no mass, no tenderness and no fullness. Left adnexum displays no mass, no tenderness and no fullness. No erythema, tenderness or bleeding around the vagina. No foreign body around the vagina. Vaginal discharge found.  Mucoid discharge within vaginal vault, no bleeding or tenderness to vaginal walls. Cervical os with mucoid discharge, no CMT or  friability. Uterus deviated slightly to the patient's right, but mobile, non-enlarged, non-tender.   Musculoskeletal: Normal range of motion.  Neurological: She is alert and oriented to person, place, and time.  Skin: Skin is warm, dry and intact. No rash noted.  Psychiatric: She has a normal mood and affect.    ED Course  Procedures (including critical care time) Labs Review Labs Reviewed  WET PREP, GENITAL - Abnormal; Notable for the following:    Clue Cells Wet Prep HPF POC MANY (*)    WBC, Wet Prep HPF POC TOO NUMEROUS TO COUNT (*)    All other components within normal limits  URINALYSIS, ROUTINE W REFLEX MICROSCOPIC - Abnormal; Notable for the following:    APPearance TURBID (*)    Hgb urine dipstick LARGE (*)    Protein, ur 30 (*)    Leukocytes, UA MODERATE (*)    All other components within normal limits  GC/CHLAMYDIA PROBE AMP  URINE MICROSCOPIC-ADD ON  POC URINE PREG, ED    Imaging Review No results found.   EKG Interpretation None      MDM   Final diagnoses:  UTI (lower urinary tract infection)  BV (bacterial vaginosis)    Stephanie [name] is a 28 y.o. female with a PMHx of frequent recurrent UTIs, presenting with UTI symptoms and vaginal discharge. Pelvic reveals mucoid discharge with no CMT. Low clinical suspicion for PID at this time. Upreg neg, no concern for ectopic. U/A reveals leuks, TNTC WBC, hematuria but rare bacteria and epithelium. Discussed with pt that  this could represent early UTI, given clinical picture of less than 12 hrs of symptoms, but that this could also be related to STD. Discussed that we have swabbed for it, but that those results would not return today. She refuses treatment for GC/CT today, stating that she hasn't eaten and does not want to take any medicines here, and that she's not worried about STDs. Pt understands that if the swab returns positive, the lab will call her with results and have to treat her at that time. Awaiting Wet  prep and then plan to d/c with bactrim for UTI. Toradol already given for pain, with some relief.   10:58 AM Wet prep revealing BV. This could be the source of the discharge, therefore feel more comfortable sending pt home without prophylactic tx of possible STDs. Pt understands she'll receive a call if the GC/CT is positive. Pt prefers metrogel vs flagyl PO, therefore will rx gel x 5 d. Will rx bactrim and pyridium for UTI. Will give small amount of percocet for unrelieved pain, but discussed with pt to reserve this as a last resort. Pt instructed to f/up with women's outpt clinic for further gyn treatment and concerns. I explained the diagnosis and have given explicit precautions to return to the ER including for any other new or worsening symptoms. The patient understands and accepts the medical plan as it's been dictated and I have answered their questions. Discharge instructions concerning home care and prescriptions have been given. The patient is STABLE and is discharged to home in good condition.  BP 113/80  Pulse 63  Temp(Src) 97.8 F (36.6 C) (Oral)  Resp 16  Ht 5\' 1"  (1.549 m)  Wt 140 lb (63.504 kg)  BMI 26.47 kg/m2  SpO2 100%  LMP 09/16/2013   Greogory Cornette Strupp Camprubi-Soms, PA-C 09/18/13 1100

## 2013-09-18 NOTE — Discharge Instructions (Signed)
You were found to have a urinary tract infection as well as bacterial vaginosis. Stay very well hydrated with plenty of water throughout the day. Take antibiotics until completed, including Bactrim for your urinary tract infection and Metrogel for your bacterial vaginosis. Use pyridium as directed to decrease painful urination but know that a common side effect is to turn your urine a bright orange/red color. This is not a harmful side effect. Follow up with women's outpatient clinic in 1 week for recheck of ongoing symptoms but return to ER for emergent changing or worsening of symptoms. Please seek immediate care if you develop the following: You develop back pain.  Your symptoms are no better, or worse in 3 days. There is severe back pain or lower abdominal pain.  You develop chills.  You have a fever.  There is nausea or vomiting.  There is continued burning or discomfort with urination.    Urinary Tract Infection A urinary tract infection (UTI) can occur any place along the urinary tract. The tract includes the kidneys, ureters, bladder, and urethra. A type of germ called bacteria often causes a UTI. UTIs are often helped with antibiotic medicine.  HOME CARE   If given, take antibiotics as told by your doctor. Finish them even if you start to feel better.  Drink enough fluids to keep your pee (urine) clear or pale yellow.  Avoid tea, drinks with caffeine, and bubbly (carbonated) drinks.  Pee often. Avoid holding your pee in for a long time.  Pee before and after having sex (intercourse).  Wipe from front to back after you poop (bowel movement) if you are a woman. Use each tissue only once. GET HELP RIGHT AWAY IF:   You have back pain.  You have lower belly (abdominal) pain.  You have chills.  You feel sick to your stomach (nauseous).  You throw up (vomit).  Your burning or discomfort with peeing does not go away.  You have a fever.  Your symptoms are not better in 3  days. MAKE SURE YOU:   Understand these instructions.  Will watch your condition.  Will get help right away if you are not doing well or get worse. Document Released: 08/06/2007 Document Revised: 11/12/2011 Document Reviewed: 09/18/2011 [facility] Patient Information 2015 [place], [place]. This information is not intended to replace advice given to you by your health care provider. Make sure you discuss any questions you have with your health care provider.  Bacterial Vaginosis Bacterial vaginosis is an infection of the vagina. It happens when too many of certain germs (bacteria) grow in the vagina. HOME CARE  Take your medicine as told by your doctor.  Finish your medicine even if you start to feel better.  Do not have sex until you finish your medicine and are better.  Tell your sex partner that you have an infection. They should see their doctor for treatment.  Practice safe sex. Use condoms. Have only one sex partner. GET HELP IF:  You are not getting better after 3 days of treatment.  You have more grey fluid (discharge) coming from your vagina than before.  You have more pain than before.  You have a fever. MAKE SURE YOU:   Understand these instructions.  Will watch your condition.  Will get help right away if you are not doing well or get worse. Document Released: 11/27/2007 Document Revised: 12/08/2012 Document Reviewed: 09/29/2012 [facility] Patient Information 2015 [place], [place]. This information is not intended to replace advice given to you  by your health care provider. Make sure you discuss any questions you have with your health care provider. ° °

## 2013-09-18 NOTE — ED Notes (Signed)
Per pt, she noted pain this morning beginning at approximately 0600. Pt states she took a shower and noticed burning during urination and was unable to return to sleep because of the discomfort, pt has had UTI's in the past and states this feels the same. Pt states the pain is currently a 7/10.

## 2013-09-19 LAB — GC/CHLAMYDIA PROBE AMP
CT Probe RNA: NEGATIVE
GC Probe RNA: NEGATIVE

## 2013-09-19 NOTE — ED Provider Notes (Signed)
Medical screening examination/treatment/procedure(s) were performed by non-physician practitioner and as supervising physician I was immediately available for consultation/collaboration.   EKG Interpretation None       Emary Zalar R. Sada Mazzoni, MD 09/19/13 0659 

## 2014-02-25 ENCOUNTER — Emergency Department (HOSPITAL_COMMUNITY)
Admission: EM | Admit: 2014-02-25 | Discharge: 2014-02-25 | Disposition: A | Discharge status: Home / Self Care | Payer: 59 | Attending: Emergency Medicine | Admitting: Emergency Medicine

## 2014-02-25 ENCOUNTER — Encounter (HOSPITAL_COMMUNITY): Payer: Self-pay | Admitting: Family Medicine

## 2014-02-25 DIAGNOSIS — Z3202 Encounter for pregnancy test, result negative: Secondary | ICD-10-CM | POA: Insufficient documentation

## 2014-02-25 DIAGNOSIS — R630 Anorexia: Secondary | ICD-10-CM | POA: Insufficient documentation

## 2014-02-25 DIAGNOSIS — R197 Diarrhea, unspecified: Secondary | ICD-10-CM | POA: Insufficient documentation

## 2014-02-25 DIAGNOSIS — Z79899 Other long term (current) drug therapy: Secondary | ICD-10-CM | POA: Insufficient documentation

## 2014-02-25 DIAGNOSIS — R109 Unspecified abdominal pain: Secondary | ICD-10-CM | POA: Insufficient documentation

## 2014-02-25 DIAGNOSIS — R1110 Vomiting, unspecified: Secondary | ICD-10-CM

## 2014-02-25 LAB — URINALYSIS, ROUTINE W REFLEX MICROSCOPIC
Bilirubin Urine: NEGATIVE
Glucose, UA: NEGATIVE mg/dL
Ketones, ur: NEGATIVE mg/dL
Nitrite: NEGATIVE
Protein, ur: 30 mg/dL — AB
Specific Gravity, Urine: 1.033 — ABNORMAL HIGH (ref 1.005–1.030)
Urobilinogen, UA: 0.2 mg/dL (ref 0.0–1.0)
pH: 6 (ref 5.0–8.0)

## 2014-02-25 LAB — URINE MICROSCOPIC-ADD ON

## 2014-02-25 LAB — POC URINE PREG, ED: Preg Test, Ur: NEGATIVE

## 2014-02-25 NOTE — ED Notes (Signed)
Patient tolerated fluids.  She is eating crackers at this time.  Patient reports diarrhea x 1 since arrival

## 2014-02-25 NOTE — ED Provider Notes (Signed)
CSN: [id number]     Arrival date & time 02/25/14  [id number] History  This chart was scribed for non-physician practitioner working with Gilda Creasehristopher J. Pollina, by Richarda Overlieichard Holland, ED Scribe. This patient was seen in room P06C/P06C and the patient's care was started at 10:30 AM.    Chief Complaint  Patient presents with  . Abdominal Pain  . Diarrhea   HPI HPI Comments: Stephanie [name] is a 28 y.o. female who presents to the Emergency Department complaining of abdominal pain that started 1 week ago. Pt reports associated diarrhea, nausea and vomiting. she says she tried Pepto bismol yesterday which failed to relieve her symptoms. She reports a decrease in appetite as well. She reports her children have been sick as well with similar symptoms. She states her last period was this past week. Pt denies any urinary symptoms. Pt states that the vomiting only lasted one day but the diarrhea has been ongoing.She reports no pertinent past medical history.    History reviewed. No pertinent past medical history. History reviewed. No pertinent past surgical history. History reviewed. No pertinent family history. History  Substance Use Topics  . Smoking status: Never Smoker   . Smokeless tobacco: Never Used  . Alcohol Use: No   OB History    No data available     Review of Systems  Constitutional: Positive for activity change.  Gastrointestinal: Positive for nausea, vomiting, abdominal pain and diarrhea.  All other systems reviewed and are negative.     Allergies  Review of patient's allergies indicates no known allergies.  Home Medications   Prior to Admission medications   Medication Sig Start Date End Date Taking? Authorizing Provider  metroNIDAZOLE (METROGEL VAGINAL) 0.75 % vaginal gel Place 1 Applicatorful vaginally at bedtime. 09/18/13   Mercedes Strupp Camprubi-Soms, PA-C  oxyCODONE-acetaminophen (PERCOCET) 5-325 MG per tablet Take 1-2 tablets by mouth every 6 (six) hours as needed for  severe pain. 09/18/13   Mercedes Strupp Camprubi-Soms, PA-C  phenazopyridine (PYRIDIUM) 200 MG tablet Take 1 tablet (200 mg total) by mouth 3 (three) times daily with meals. As needed for your painful urination 09/18/13   Donnita FallsMercedes Strupp Camprubi-Soms, PA-C  sulfamethoxazole-trimethoprim (BACTRIM DS) 800-160 MG per tablet Take 1 tablet by mouth 2 (two) times daily. 09/18/13   Mercedes Strupp Camprubi-Soms, PA-C   BP 105/68 mmHg  Pulse 70  Temp(Src) 97.8 F (36.6 C) (Oral)  Resp 18  SpO2 100%  LMP 02/24/2014 Physical Exam  Constitutional: She is oriented to person, place, and time. She appears well-developed and well-nourished. No distress.  HENT:  Head: Normocephalic and atraumatic.  Mouth/Throat: Oropharynx is clear and moist.  Eyes: Right eye exhibits no discharge. Left eye exhibits no discharge.  Neck: Neck supple. No tracheal deviation present.  Cardiovascular: Normal rate.   Pulmonary/Chest: Effort normal. No respiratory distress.  Abdominal: Soft. Bowel sounds are normal. She exhibits no distension. There is no tenderness.  Neurological: She is alert and oriented to person, place, and time.  Skin: Skin is warm and dry.  Psychiatric: She has a normal mood and affect. Her behavior is normal.  Nursing note and vitals reviewed.   ED Course  Procedures   DIAGNOSTIC STUDIES: Oxygen Saturation is 100% on RA, normal by my interpretation.    COORDINATION OF CARE: 10:34 AM Discussed treatment plan with pt at bedside and pt agreed to plan.   Labs Review Labs Reviewed  URINALYSIS, ROUTINE W REFLEX MICROSCOPIC - Abnormal; Notable for the following:    APPearance CLOUDY (*)  Specific Gravity, Urine 1.033 (*)    Hgb urine dipstick LARGE (*)    Protein, ur 30 (*)    Leukocytes, UA SMALL (*)    All other components within normal limits  URINE MICROSCOPIC-ADD ON - Abnormal; Notable for the following:    Squamous Epithelial / LPF FEW (*)    Bacteria, UA FEW (*)    All other  components within normal limits  URINE CULTURE  POC URINE PREG, ED    Imaging Review No results found.   EKG Interpretation None      MDM   Final diagnoses:  Vomiting and diarrhea    Pt tolerating po here without any problems. Likely viral. Use likely contaminated. Sent for culture. Discussed otc medications for diarrhea with pt. Likely viral in nature. Abdomen is benign  I personally performed the services described in this documentation, which was scribed in my presence. The recorded information has been reviewed and is accurate.      Teressa LowerVrinda Latarsha Zani, NP 02/25/14 1151  Gilda Creasehristopher J. Pollina, MD 02/25/14 [phone]

## 2014-02-25 NOTE — ED Notes (Signed)
Pt sts abd pain, N,V,D. sts both of her daughters have the same thing. sts x 1 week. sts she has been drinking fluids.

## 2014-02-25 NOTE — Discharge Instructions (Signed)
Use immodium as needed as discussed Viral Infections A viral infection can be caused by different types of viruses.Most viral infections are not serious and resolve on their own. However, some infections may cause severe symptoms and may lead to further complications. SYMPTOMS Viruses can frequently cause:  Minor sore throat.  Aches and pains.  Headaches.  Runny nose.  Different types of rashes.  Watery eyes.  Tiredness.  Cough.  Loss of appetite.  Gastrointestinal infections, resulting in nausea, vomiting, and diarrhea. These symptoms do not respond to antibiotics because the infection is not caused by bacteria. However, you might catch a bacterial infection following the viral infection. This is sometimes called a "superinfection." Symptoms of such a bacterial infection may include:  Worsening sore throat with pus and difficulty swallowing.  Swollen neck glands.  Chills and a high or persistent fever.  Severe headache.  Tenderness over the sinuses.  Persistent overall ill feeling (malaise), muscle aches, and tiredness (fatigue).  Persistent cough.  Yellow, green, or brown mucus production with coughing. HOME CARE INSTRUCTIONS   Only take over-the-counter or prescription medicines for pain, discomfort, diarrhea, or fever as directed by your caregiver.  Drink enough water and fluids to keep your urine clear or pale yellow. Sports drinks can provide valuable electrolytes, sugars, and hydration.  Get plenty of rest and maintain proper nutrition. Soups and broths with crackers or rice are fine. SEEK IMMEDIATE MEDICAL CARE IF:   You have severe headaches, shortness of breath, chest pain, neck pain, or an unusual rash.  You have uncontrolled vomiting, diarrhea, or you are unable to keep down fluids.  You or your child has an oral temperature above 102 F (38.9 C), not controlled by medicine.  Your baby is older than 3 months with a rectal temperature of 102 F  (38.9 C) or higher.  Your baby is 643 months old or younger with a rectal temperature of 100.4 F (38 C) or higher. MAKE SURE YOU:   Understand these instructions.  Will watch your condition.  Will get help right away if you are not doing well or get worse. Document Released: 11/27/2004 Document Revised: 05/12/2011 Document Reviewed: 06/24/2010 [facility] Patient Information 2015 [employer] [place]. This information is not intended to replace advice given to you by your health care provider. Make sure you discuss any questions you have with your health care provider.

## 2014-02-27 LAB — URINE CULTURE: Colony Count: 30000

## 2015-01-19 ENCOUNTER — Emergency Department (HOSPITAL_COMMUNITY)
Admission: EM | Admit: 2015-01-19 | Discharge: 2015-01-20 | Disposition: A | Discharge status: Home / Self Care | Payer: Self-pay | Attending: Emergency Medicine | Admitting: Emergency Medicine

## 2015-01-19 DIAGNOSIS — M722 Plantar fascial fibromatosis: Secondary | ICD-10-CM | POA: Insufficient documentation

## 2015-01-19 DIAGNOSIS — M79671 Pain in right foot: Secondary | ICD-10-CM

## 2015-01-19 DIAGNOSIS — Z792 Long term (current) use of antibiotics: Secondary | ICD-10-CM | POA: Insufficient documentation

## 2015-01-19 NOTE — ED Notes (Signed)
Pt states that she began having right foot pain yesterday afternoon; pt denies injury; pt states that it hurts worse to apply pressure to foot; pt states that the pain is to the bottom, middle of the foot in the arch; no swelling or deformity noted

## 2015-01-20 ENCOUNTER — Emergency Department (HOSPITAL_COMMUNITY): Payer: Self-pay

## 2015-01-20 ENCOUNTER — Encounter (HOSPITAL_COMMUNITY): Payer: Self-pay | Admitting: *Deleted

## 2015-01-20 MED ORDER — ACETAMINOPHEN 325 MG PO TABS
650.0000 mg | ORAL_TABLET | Freq: Once | ORAL | Status: AC
  Administered 2015-01-20: 650 mg via ORAL
  Filled 2015-01-20: qty 2

## 2015-01-20 MED ORDER — IBUPROFEN 800 MG PO TABS: 800.0000 mg | ORAL_TABLET | Freq: Three times a day (TID) | ORAL | Status: DC | PRN

## 2015-01-20 MED ORDER — KETOROLAC TROMETHAMINE 30 MG/ML IJ SOLN
30.0000 mg | Freq: Once | INTRAMUSCULAR | Status: AC
  Administered 2015-01-20: 30 mg via INTRAMUSCULAR
  Filled 2015-01-20: qty 1

## 2015-01-20 NOTE — ED Provider Notes (Signed)
CSN: [id number]     Arrival date & time 01/19/15  2306 History   First MD Initiated Contact with Patient 01/20/15 0022     Chief Complaint  Patient presents with  . Foot Pain     HPI   [name] is an 29 y.o. female with no significant PMH who presents to the ED for evaluation of R foot pain. She states she was in her usual state of health until yesterday when she began experiencing sharp, constant pain along the plantar aspect of her right foot. She states the pain is 10/10. She is tearful in the room. Denies injury or trauma. States she has not tried anything for the pain. She states she works third shift at [employer] and is on her feet a lot. Denies numbness, tingling, muscle weakness/cramps. Denies fever, chills, N/V, chest pain, SOB.   History reviewed. No pertinent past medical history. History reviewed. No pertinent past surgical history. No family history on file. Social History  Substance Use Topics  . Smoking status: Never Smoker   . Smokeless tobacco: Never Used  . Alcohol Use: No   OB History    No data available     Review of Systems  All other systems reviewed and are negative.     Allergies  Review of patient's allergies indicates no known allergies.  Home Medications   Prior to Admission medications   Medication Sig Start Date End Date Taking? Authorizing Provider  metroNIDAZOLE (METROGEL VAGINAL) 0.75 % vaginal gel Place 1 Applicatorful vaginally at bedtime. 09/18/13   Mercedes Camprubi-Soms, PA-C  oxyCODONE-acetaminophen (PERCOCET) 5-325 MG per tablet Take 1-2 tablets by mouth every 6 (six) hours as needed for severe pain. 09/18/13   Mercedes Camprubi-Soms, PA-C  phenazopyridine (PYRIDIUM) 200 MG tablet Take 1 tablet (200 mg total) by mouth 3 (three) times daily with meals. As needed for your painful urination 09/18/13   Mercedes Camprubi-Soms, PA-C  sulfamethoxazole-trimethoprim (BACTRIM DS) 800-160 MG per tablet Take 1 tablet by mouth 2 (two) times daily.  09/18/13   Mercedes Camprubi-Soms, PA-C   BP 97/69 mmHg  Pulse 73  Temp(Src) 97.7 F (36.5 C) (Oral)  Resp 20  SpO2 98%  LMP 01/11/2015 Physical Exam  Constitutional: She is oriented to person, place, and time. She appears well-developed and well-nourished.  Tearful   HENT:  Head: Atraumatic.  Right Ear: External ear normal.  Left Ear: External ear normal.  Mouth/Throat: Oropharynx is clear and moist.  Eyes: Conjunctivae and EOM are normal. No scleral icterus.  Neck: Normal range of motion. Neck supple. No tracheal deviation present.  Cardiovascular: Normal rate, regular rhythm and normal heart sounds.   Pulmonary/Chest: Effort normal. No respiratory distress.  Abdominal: Soft. She exhibits no distension. There is no tenderness.  Musculoskeletal: Normal range of motion. She exhibits no edema.  Marked tenderness to plantar R foot especially laterally. Unable to bear weight in room 2/2 pain. Sensation and strength intact.   Neurological: She is alert and oriented to person, place, and time.  Skin: Skin is warm and dry. She is not diaphoretic.  Psychiatric: She has a normal mood and affect. Her behavior is normal.  Nursing note and vitals reviewed.   ED Course  Procedures (including critical care time) Labs Review Labs Reviewed - No data to display  Imaging Review Dg Foot Complete Right  01/20/2015  CLINICAL DATA:  Right foot pain beginning yesterday. No known injury. EXAM: RIGHT FOOT COMPLETE - 3+ VIEW COMPARISON:  None. FINDINGS: There is  no evidence of fracture or dislocation. There is no evidence of arthropathy or other focal bone abnormality. Soft tissues are unremarkable. IMPRESSION: Negative. Electronically Signed   By: Myles RosenthalJohn  Stahl M.D.   On: 01/20/2015 00:28   I have personally reviewed and evaluated these images and lab results as part of my medical decision-making.   EKG Interpretation None      MDM   Final diagnoses:  Plantar fasciitis of right foot  Foot  pain, right    Foot XR  Negative. I think this is likely plantar fascitis or other tendionpathy. Discussed with pt. Will give toradol here. Instructed to rest, use NSAIDs. Will give crutches and air cast to use this weekend to rest foot. Will give contact info for f/u with ortho if no improvement in symptoms.     Carlene CoriaSerena Y Darshana Curnutt, PA-C 01/20/15 [id number]  Layla MawKristen N Ward, DO 01/20/15 [id number]

## 2015-01-20 NOTE — Discharge Instructions (Signed)
As we discussed, I think your pain is due to inflammation of your tendons/fascia. Use the crutches and air cast as needed to rest your foot. You may take ibuprofen 800mg  three times daily as needed for pain. If your symptoms do not improve, please follow up with orthopedics for further evaluation.    Please obtain all of your results from medical records or have your doctors office obtain the results - share them with your doctor - you should be seen at your doctors office in the next 2 days. Call today to arrange your follow up. Take the medications as prescribed. Please review all of the medicines and only take them if you do not have an allergy to them. Please be aware that if you are taking birth control pills, taking other prescriptions, ESPECIALLY ANTIBIOTICS may make the birth control ineffective - if this is the case, either do not engage in sexual activity or use alternative methods of birth control such as condoms until you have finished the medicine and your family doctor says it is OK to restart them. If you are on a blood thinner such as COUMADIN, be aware that any other medicine that you take may cause the coumadin to either work too much, or not enough - you should have your coumadin level rechecked in next 7 days if this is the case.  ?  It is also a possibility that you have an allergic reaction to any of the medicines that you have been prescribed - Everybody reacts differently to medications and while MOST people have no trouble with most medicines, you may have a reaction such as nausea, vomiting, rash, swelling, shortness of breath. If this is the case, please stop taking the medicine immediately and contact your physician.  ?  You should return to the ER if you develop severe or worsening symptoms.

## 2015-02-12 ENCOUNTER — Emergency Department (HOSPITAL_COMMUNITY)
Admission: EM | Admit: 2015-02-12 | Discharge: 2015-02-12 | Disposition: A | Discharge status: Home / Self Care | Payer: Medicaid Other | Attending: Emergency Medicine | Admitting: Emergency Medicine

## 2015-02-12 ENCOUNTER — Encounter (HOSPITAL_COMMUNITY): Payer: Self-pay

## 2015-02-12 DIAGNOSIS — Z202 Contact with and (suspected) exposure to infections with a predominantly sexual mode of transmission: Secondary | ICD-10-CM

## 2015-02-12 DIAGNOSIS — Z3202 Encounter for pregnancy test, result negative: Secondary | ICD-10-CM | POA: Insufficient documentation

## 2015-02-12 DIAGNOSIS — N760 Acute vaginitis: Secondary | ICD-10-CM | POA: Insufficient documentation

## 2015-02-12 DIAGNOSIS — B9689 Other specified bacterial agents as the cause of diseases classified elsewhere: Secondary | ICD-10-CM

## 2015-02-12 LAB — WET PREP, GENITAL
Sperm: NONE SEEN
Trich, Wet Prep: NONE SEEN
Yeast Wet Prep HPF POC: NONE SEEN

## 2015-02-12 LAB — I-STAT BETA HCG BLOOD, ED (MC, WL, AP ONLY)

## 2015-02-12 MED ORDER — AZITHROMYCIN 250 MG PO TABS
1000.0000 mg | ORAL_TABLET | Freq: Once | ORAL | Status: AC
  Administered 2015-02-12: 1000 mg via ORAL
  Filled 2015-02-12: qty 4

## 2015-02-12 MED ORDER — CEFTRIAXONE SODIUM 250 MG IJ SOLR
250.0000 mg | Freq: Once | INTRAMUSCULAR | Status: AC
  Administered 2015-02-12: 250 mg via INTRAMUSCULAR
  Filled 2015-02-12: qty 250

## 2015-02-12 MED ORDER — ONDANSETRON 8 MG PO TBDP
8.0000 mg | ORAL_TABLET | Freq: Once | ORAL | Status: AC
  Administered 2015-02-12: 8 mg via ORAL
  Filled 2015-02-12: qty 1

## 2015-02-12 MED ORDER — LIDOCAINE HCL 1 % IJ SOLN
INTRAMUSCULAR | Status: AC
  Administered 2015-02-12: 1.1 mL
  Filled 2015-02-12: qty 20

## 2015-02-12 MED ORDER — METRONIDAZOLE 500 MG PO TABS: 500.0000 mg | ORAL_TABLET | Freq: Two times a day (BID) | ORAL | Status: DC

## 2015-02-12 NOTE — Discharge Instructions (Signed)
Read the information below.  Use the prescribed medication as directed.  Please discuss all new medications with your pharmacist.   Do not drink alcohol while taking the prescribed antibiotic as it will likely make you sick (nausea and vomiting).  You may return to the Emergency Department at any time for worsening condition or any new symptoms that concern you.  If you develop high fevers, worsening abdominal pain, uncontrolled vomiting, or are unable to tolerate fluids by mouth, return to the ER for a recheck.     Bacterial Vaginosis Bacterial vaginosis is a vaginal infection that occurs when the normal balance of bacteria in the vagina is disrupted. It results from an overgrowth of certain bacteria. This is the most common vaginal infection in women of childbearing age. Treatment is important to prevent complications, especially in pregnant women, as it can cause a premature delivery. CAUSES  Bacterial vaginosis is caused by an increase in harmful bacteria that are normally present in smaller amounts in the vagina. Several different kinds of bacteria can cause bacterial vaginosis. However, the reason that the condition develops is not fully understood. RISK FACTORS Certain activities or behaviors can put you at an increased risk of developing bacterial vaginosis, including:  Having a new sex partner or multiple sex partners.  Douching.  Using an intrauterine device (IUD) for contraception. Women do not get bacterial vaginosis from toilet seats, bedding, swimming pools, or contact with objects around them. SIGNS AND SYMPTOMS  Some women with bacterial vaginosis have no signs or symptoms. Common symptoms include:  Grey vaginal discharge.  A fishlike odor with discharge, especially after sexual intercourse.  Itching or burning of the vagina and vulva.  Burning or pain with urination. DIAGNOSIS  Your health care provider will take a medical history and examine the vagina for signs of  bacterial vaginosis. A sample of vaginal fluid may be taken. Your health care provider will look at this sample under a microscope to check for bacteria and abnormal cells. A vaginal pH test may also be done.  TREATMENT  Bacterial vaginosis may be treated with antibiotic medicines. These may be given in the form of a pill or a vaginal cream. A second round of antibiotics may be prescribed if the condition comes back after treatment. Because bacterial vaginosis increases your risk for sexually transmitted diseases, getting treated can help reduce your risk for chlamydia, gonorrhea, HIV, and herpes. HOME CARE INSTRUCTIONS   Only take over-the-counter or prescription medicines as directed by your health care provider.  If antibiotic medicine was prescribed, take it as directed. Make sure you finish it even if you start to feel better.  Tell all sexual partners that you have a vaginal infection. They should see their health care provider and be treated if they have problems, such as a mild rash or itching.  During treatment, it is important that you follow these instructions:  Avoid sexual activity or use condoms correctly.  Do not douche.  Avoid alcohol as directed by your health care provider.  Avoid breastfeeding as directed by your health care provider. SEEK MEDICAL CARE IF:   Your symptoms are not improving after 3 days of treatment.  You have increased discharge or pain.  You have a fever. MAKE SURE YOU:   Understand these instructions.  Will watch your condition.  Will get help right away if you are not doing well or get worse. FOR MORE INFORMATION  Centers for Disease Control and Prevention, Division of STD Prevention: [employer] American  Sexual Health Association (ASHA): www.ashastd.org    This information is not intended to replace advice given to you by your health care provider. Make sure you discuss any questions you have with your health care provider.     Document Released: 02/17/2005 Document Revised: 03/10/2014 Document Reviewed: 09/29/2012 Elsevier Interactive Patient Education 2016 [employer]  Sexually Transmitted Disease A sexually transmitted disease (STD) is a disease or infection that may be passed (transmitted) from person to person, usually during sexual activity. This may happen by way of saliva, semen, blood, vaginal mucus, or urine. Common STDs include:  Gonorrhea.  Chlamydia.  Syphilis.  HIV and AIDS.  Genital herpes.  Hepatitis B and C.  Trichomonas.  Human papillomavirus (HPV).  Pubic lice.  Scabies.  Mites.  Bacterial vaginosis. WHAT ARE CAUSES OF STDs? An STD may be caused by bacteria, a virus, or parasites. STDs are often transmitted during sexual activity if one person is infected. However, they may also be transmitted through nonsexual means. STDs may be transmitted after:   Sexual intercourse with an infected person.  Sharing sex toys with an infected person.  Sharing needles with an infected person or using unclean piercing or tattoo needles.  Having intimate contact with the genitals, mouth, or rectal areas of an infected person.  Exposure to infected fluids during birth. WHAT ARE THE SIGNS AND SYMPTOMS OF STDs? Different STDs have different symptoms. Some people may not have any symptoms. If symptoms are present, they may include:  Painful or bloody urination.  Pain in the pelvis, abdomen, vagina, anus, throat, or eyes.  A skin rash, itching, or irritation.  Growths, ulcerations, blisters, or sores in the genital and anal areas.  Abnormal vaginal discharge with or without bad odor.  Penile discharge in men.  Fever.  Pain or bleeding during sexual intercourse.  Swollen glands in the groin area.  Yellow skin and eyes (jaundice). This is seen with hepatitis.  Swollen testicles.  Infertility.  Sores and blisters in the mouth. HOW ARE STDs DIAGNOSED? To make a diagnosis,  your health care provider may:  Take a medical history.  Perform a physical exam.  Take a sample of any discharge to examine.  Swab the throat, cervix, opening to the penis, rectum, or vagina for testing.  Test a sample of your first morning urine.  Perform blood tests.  Perform a Pap test, if this applies.  Perform a colposcopy.  Perform a laparoscopy. HOW ARE STDs TREATED? Treatment depends on the STD. Some STDs may be treated but not cured.  Chlamydia, gonorrhea, trichomonas, and syphilis can be cured with antibiotic medicine.  Genital herpes, hepatitis, and HIV can be treated, but not cured, with prescribed medicines. The medicines lessen symptoms.  Genital warts from HPV can be treated with medicine or by freezing, burning (electrocautery), or surgery. Warts may come back.  HPV cannot be cured with medicine or surgery. However, abnormal areas may be removed from the cervix, vagina, or vulva.  If your diagnosis is confirmed, your recent sexual partners need treatment. This is true even if they are symptom-free or have a negative culture or evaluation. They should not have sex until their health care providers say it is okay.  Your health care provider may test you for infection again 3 months after treatment. HOW CAN I REDUCE MY RISK OF GETTING AN STD? Take these steps to reduce your risk of getting an STD:  Use latex condoms, dental dams, and water-soluble lubricants during sexual activity. Do not  use petroleum jelly or oils.  Avoid having multiple sex partners.  Do not have sex with someone who has other sex partners  Do not have sex with anyone you do not know or who is at high risk for an STD.  Avoid risky sex practices that can break your skin.  Do not have sex if you have open sores on your mouth or skin.  Avoid drinking too much alcohol or taking illegal drugs. Alcohol and drugs can affect your judgment and put you in a vulnerable position.  Avoid engaging  in oral and anal sex acts.  Get vaccinated for HPV and hepatitis. If you have not received these vaccines in the past, talk to your health care provider about whether one or both might be right for you.  If you are at risk of being infected with HIV, it is recommended that you take a prescription medicine daily to prevent HIV infection. This is called pre-exposure prophylaxis (PrEP). You are considered at risk if:  You are a man who has sex with other men (MSM).  You are a heterosexual man or woman and are sexually active with more than one partner.  You take drugs by injection.  You are sexually active with a partner who has HIV.  Talk with your health care provider about whether you are at high risk of being infected with HIV. If you choose to begin PrEP, you should first be tested for HIV. You should then be tested every 3 months for as long as you are taking PrEP. WHAT SHOULD I DO IF I THINK I HAVE AN STD?  See your health care provider.  Tell your sexual partner(s). They should be tested and treated for any STDs.  Do not have sex until your health care provider says it is okay. WHEN SHOULD I GET IMMEDIATE MEDICAL CARE? Contact your health care provider right away if:   You have severe abdominal pain.  You are a man and notice swelling or pain in your testicles.  You are a woman and notice swelling or pain in your vagina.   This information is not intended to replace advice given to you by your health care provider. Make sure you discuss any questions you have with your health care provider.   Document Released: 05/10/2002 Document Revised: 03/10/2014 Document Reviewed: 09/07/2012 Elsevier Interactive Patient Education [employer]

## 2015-02-12 NOTE — ED Notes (Addendum)
Pt spouse called and told he had chlamydia.  Pt with some discharge

## 2015-02-12 NOTE — ED Provider Notes (Signed)
CSN: [id number]     Arrival date & time 02/12/15  [id number] History   First MD Initiated Contact with Patient 02/12/15 1029     Chief Complaint  Patient presents with  . Exposure to STD     (Consider location/radiation/quality/duration/timing/severity/associated sxs/prior Treatment) The history is provided by the patient.     Patient presents with concern for exposure to Chlamydia.  States her significant other told her he tested positive for chlamydia.  States she has had abnormal vaginal discharge for one week with some lower abdominal cramping.  LMP began today.  Normally has irregular periods.  Denies fevers, N/V, urinary or bowel symptoms.   History reviewed. No pertinent past medical history. History reviewed. No pertinent past surgical history. History reviewed. No pertinent family history. Social History  Substance Use Topics  . Smoking status: Never Smoker   . Smokeless tobacco: Never Used  . Alcohol Use: No   OB History    No data available     Review of Systems  Constitutional: Negative for fever.  Cardiovascular: Negative for chest pain.  Gastrointestinal: Positive for abdominal pain. Negative for nausea, vomiting and diarrhea.  Genitourinary: Positive for vaginal bleeding and vaginal discharge. Negative for dysuria, urgency and frequency.  Musculoskeletal: Negative for myalgias.  Allergic/Immunologic: Negative for immunocompromised state.  Hematological: Does not bruise/bleed easily.  Psychiatric/Behavioral: Negative for self-injury.      Allergies  Review of patient's allergies indicates no known allergies.  Home Medications   Prior to Admission medications   Medication Sig Start Date End Date Taking? Authorizing Provider  ibuprofen (ADVIL,MOTRIN) 800 MG tablet Take 1 tablet (800 mg total) by mouth every 8 (eight) hours as needed. Patient not taking: Reported on 02/12/2015 01/20/15   Ace GinsSerena Y Sam, PA-C  metroNIDAZOLE (METROGEL VAGINAL) 0.75 % vaginal gel  Place 1 Applicatorful vaginally at bedtime. Patient not taking: Reported on 01/20/2015 09/18/13   Mercedes Camprubi-Soms, PA-C  oxyCODONE-acetaminophen (PERCOCET) 5-325 MG per tablet Take 1-2 tablets by mouth every 6 (six) hours as needed for severe pain. Patient not taking: Reported on 01/20/2015 09/18/13   Mercedes Camprubi-Soms, PA-C  phenazopyridine (PYRIDIUM) 200 MG tablet Take 1 tablet (200 mg total) by mouth 3 (three) times daily with meals. As needed for your painful urination Patient not taking: Reported on 01/20/2015 09/18/13   Mercedes Camprubi-Soms, PA-C  sulfamethoxazole-trimethoprim (BACTRIM DS) 800-160 MG per tablet Take 1 tablet by mouth 2 (two) times daily. Patient not taking: Reported on 01/20/2015 09/18/13   Mercedes Camprubi-Soms, PA-C   BP 115/79 mmHg  Pulse 73  Temp(Src) 97.8 F (36.6 C) (Oral)  Resp 16  SpO2 100%  LMP 02/12/2015 Physical Exam  Constitutional: She appears well-developed and well-nourished. No distress.  HENT:  Head: Normocephalic and atraumatic.  Neck: Neck supple.  Pulmonary/Chest: Effort normal.  Abdominal: Soft. She exhibits no distension. There is no tenderness. There is no rebound and no guarding.  Genitourinary: Cervix exhibits no motion tenderness. Right adnexum displays no mass, no tenderness and no fullness. Left adnexum displays no mass, no tenderness and no fullness. There is bleeding in the vagina.  Malodorous, moderate amount of dark thin blood in vagina  Neurological: She is alert.  Skin: She is not diaphoretic.  Nursing note and vitals reviewed.   ED Course  Procedures (including critical care time) Labs Review Labs Reviewed  WET PREP, GENITAL - Abnormal; Notable for the following:    Clue Cells Wet Prep HPF POC PRESENT (*)    WBC, Wet Prep HPF POC  FEW (*)    All other components within normal limits  HIV ANTIBODY (ROUTINE TESTING)  RPR  I-STAT BETA HCG BLOOD, ED ([facility], [facility], [facility] ONLY)  GC/CHLAMYDIA PROBE AMP () NOT AT  [facility]    Imaging Review No results found. I have personally reviewed and evaluated these images and lab results as part of my medical decision-making.   EKG Interpretation None      MDM   Final diagnoses:  BV (bacterial vaginosis)  Possible exposure to STD   Afebrile, nontoxic patient with possible exposure to chlamydia, also found to have BV.  Treated for GC/Chlam and d/c home with BV medication.  Clinically no e/o PID.  DOubt TOA.   Pregnancy test is negative and pt currently menstruating.  D/C home with flagyl.   Discussed result, findings, treatment, and follow up  with patient.  Pt given return precautions.  Pt verbalizes understanding and agrees with plan.        Trixie Dredge, PA-C 02/12/15 1603  Melene Plan, DO 02/12/15 [phone]

## 2015-02-13 LAB — RPR: RPR Ser Ql: NONREACTIVE

## 2015-02-13 LAB — GC/CHLAMYDIA PROBE AMP (~~LOC~~) NOT AT ARMC
CHLAMYDIA, DNA PROBE: NEGATIVE
Neisseria Gonorrhea: NEGATIVE

## 2015-02-13 LAB — HIV ANTIBODY (ROUTINE TESTING W REFLEX): HIV SCREEN 4TH GENERATION: NONREACTIVE

## 2015-05-13 ENCOUNTER — Emergency Department (HOSPITAL_COMMUNITY)
Admission: EM | Admit: 2015-05-13 | Discharge: 2015-05-13 | Disposition: A | Discharge status: Home / Self Care | Payer: Medicaid Other | Attending: Emergency Medicine | Admitting: Emergency Medicine

## 2015-05-13 ENCOUNTER — Encounter (HOSPITAL_COMMUNITY): Payer: Self-pay | Admitting: Emergency Medicine

## 2015-05-13 ENCOUNTER — Emergency Department (HOSPITAL_COMMUNITY): Payer: Medicaid Other

## 2015-05-13 DIAGNOSIS — Z3202 Encounter for pregnancy test, result negative: Secondary | ICD-10-CM | POA: Insufficient documentation

## 2015-05-13 DIAGNOSIS — R1031 Right lower quadrant pain: Secondary | ICD-10-CM | POA: Diagnosis present

## 2015-05-13 DIAGNOSIS — Z91040 Latex allergy status: Secondary | ICD-10-CM | POA: Insufficient documentation

## 2015-05-13 DIAGNOSIS — N83201 Unspecified ovarian cyst, right side: Secondary | ICD-10-CM | POA: Insufficient documentation

## 2015-05-13 DIAGNOSIS — R102 Pelvic and perineal pain: Secondary | ICD-10-CM

## 2015-05-13 LAB — COMPREHENSIVE METABOLIC PANEL
ALBUMIN: 4.2 g/dL (ref 3.5–5.0)
ALT: 13 U/L — ABNORMAL LOW (ref 14–54)
ANION GAP: 8 (ref 5–15)
AST: 17 U/L (ref 15–41)
Alkaline Phosphatase: 41 U/L (ref 38–126)
BILIRUBIN TOTAL: 0.6 mg/dL (ref 0.3–1.2)
BUN: 14 mg/dL (ref 6–20)
CHLORIDE: 103 mmol/L (ref 101–111)
CO2: 28 mmol/L (ref 22–32)
Calcium: 10 mg/dL (ref 8.9–10.3)
Creatinine, Ser: 0.69 mg/dL (ref 0.44–1.00)
GFR calc Af Amer: >60 mL/min (ref >60)
Glucose, Bld: 82 mg/dL (ref 65–99)
POTASSIUM: 4.3 mmol/L (ref 3.5–5.1)
Sodium: 139 mmol/L (ref 135–145)
TOTAL PROTEIN: 7.8 g/dL (ref 6.5–8.1)

## 2015-05-13 LAB — URINALYSIS, ROUTINE W REFLEX MICROSCOPIC
Bilirubin Urine: NEGATIVE
Glucose, UA: NEGATIVE mg/dL
Hgb urine dipstick: NEGATIVE
Ketones, ur: NEGATIVE mg/dL
LEUKOCYTES UA: NEGATIVE
NITRITE: NEGATIVE
PH: 8 (ref 5.0–8.0)
Protein, ur: NEGATIVE mg/dL
SPECIFIC GRAVITY, URINE: 1.023 (ref 1.005–1.030)

## 2015-05-13 LAB — CBC
HEMATOCRIT: 37.8 % (ref 36.0–46.0)
HEMOGLOBIN: 12.4 g/dL (ref 12.0–15.0)
MCH: 28.6 pg (ref 26.0–34.0)
MCHC: 32.8 g/dL (ref 30.0–36.0)
MCV: 87.3 fL (ref 78.0–100.0)
Platelets: 210 10*3/uL (ref 150–400)
RBC: 4.33 MIL/uL (ref 3.87–5.11)
RDW: 12.4 % (ref 11.5–15.5)
WBC: 5.1 10*3/uL (ref 4.0–10.5)

## 2015-05-13 LAB — WET PREP, GENITAL
SPERM: NONE SEEN
TRICH WET PREP: NONE SEEN
YEAST WET PREP: NONE SEEN

## 2015-05-13 LAB — HCG, QUANTITATIVE, PREGNANCY: hCG, Beta Chain, Quant, S: <1 m[IU]/mL (ref <5)

## 2015-05-13 LAB — LIPASE, BLOOD: LIPASE: 44 U/L (ref 11–51)

## 2015-05-13 MED ORDER — IBUPROFEN 600 MG PO TABS: 600.0000 mg | ORAL_TABLET | Freq: Four times a day (QID) | ORAL | Status: DC | PRN

## 2015-05-13 MED ORDER — IBUPROFEN 800 MG PO TABS
800.0000 mg | ORAL_TABLET | Freq: Once | ORAL | Status: AC
  Administered 2015-05-13: 800 mg via ORAL
  Filled 2015-05-13: qty 1

## 2015-05-13 NOTE — ED Provider Notes (Signed)
CSN: [id number]     Arrival date & time 05/13/15  1509 History   First MD Initiated Contact with Patient 05/13/15 1528     Chief Complaint  Patient presents with  . Abdominal Pain     (Consider location/radiation/quality/duration/timing/severity/associated sxs/prior Treatment) HPI Patient has had intermittent right lower quadrant pain for a number of months. It comes and goes. She reports that she may experience it several times over the course of a month. She reports is sharp and aching in quality. Last menstrual cycle was last week. The pain burning or urgency. Urination. No fever. No nausea vomiting diarrhea. Pain worse with intercourse. History reviewed. No pertinent past medical history. History reviewed. No pertinent past surgical history. No family history on file. Social History  Substance Use Topics  . Smoking status: Never Smoker   . Smokeless tobacco: Never Used  . Alcohol Use: No   OB History    No data available     Review of Systems  10 Systems reviewed and are negative for acute change except as noted in the HPI.   Allergies  Latex  Home Medications   Prior to Admission medications   Medication Sig Start Date End Date Taking? Authorizing Provider  acetaminophen (TYLENOL) 500 MG tablet Take 1,000 mg by mouth every 6 (six) hours as needed for moderate pain or headache.   Yes Historical Provider, MD  ibuprofen (ADVIL,MOTRIN) 600 MG tablet Take 1 tablet (600 mg total) by mouth every 6 (six) hours as needed. 05/13/15   Arby BarretteMarcy Rossy Virag, MD  ibuprofen (ADVIL,MOTRIN) 800 MG tablet Take 1 tablet (800 mg total) by mouth every 8 (eight) hours as needed. Patient not taking: Reported on 02/12/2015 01/20/15   Ace GinsSerena Y Sam, PA-C  metroNIDAZOLE (FLAGYL) 500 MG tablet Take 1 tablet (500 mg total) by mouth 2 (two) times daily. One po bid x 7 days 02/12/15   Trixie DredgeEmily West, PA-C   BP 106/78 mmHg  Pulse 66  Temp(Src) 98.1 F (36.7 C) (Oral)  Resp 16  SpO2 100%  LMP  05/06/2015 Physical Exam  Constitutional: She is oriented to person, place, and time. She appears well-developed and well-nourished.  HENT:  Head: Normocephalic and atraumatic.  Eyes: EOM are normal. Pupils are equal, round, and reactive to light.  Neck: Neck supple.  Cardiovascular: Normal rate, regular rhythm, normal heart sounds and intact distal pulses.   Pulmonary/Chest: Effort normal and breath sounds normal.  Abdominal: Soft. Bowel sounds are normal. She exhibits no distension. There is tenderness.  Very low right lower quadrant\suprapubic tenderness to palpation. No guarding. No mass.  Genitourinary:  Normal external female genitalia. Speculum examination moderate white discharge in the vaginal vault. No discharge from the cervix. No cervical friability. Bimanual examination moderate localizing tenderness in the right adnexa. Ports this is reproducing her pain. Left adnexa nontender. Mild diffuse uterine tenderness to palpation.  Musculoskeletal: Normal range of motion. She exhibits no edema or tenderness.  Neurological: She is alert and oriented to person, place, and time. She has normal strength. Coordination normal. GCS eye subscore is 4. GCS verbal subscore is 5. GCS motor subscore is 6.  Skin: Skin is warm, dry and intact.  Psychiatric: She has a normal mood and affect.    ED Course  Procedures (including critical care time) Labs Review Labs Reviewed  WET PREP, GENITAL - Abnormal; Notable for the following:    Clue Cells Wet Prep HPF POC RARE (*)    WBC, Wet Prep HPF POC FEW (*)  All other components within normal limits  COMPREHENSIVE METABOLIC PANEL - Abnormal; Notable for the following:    ALT 13 (*)    All other components within normal limits  LIPASE, BLOOD  CBC  URINALYSIS, ROUTINE W REFLEX MICROSCOPIC (NOT AT [facility])  HCG, QUANTITATIVE, PREGNANCY  GC/CHLAMYDIA PROBE AMP ([place]) NOT AT [facility]    Imaging Review US Transvaginal Non-ob  05/13/2015  CLINICAL  DATA:  Right lower quadrant pain. EXAM: TRANSABDOMINAL AND TRANSVAGINAL ULTRASOUND OF PELVIS TECHNIQUE: Both transabdominal and transvaginal ultrasound examinations of the pelvis were performed. Transabdominal technique was performed for global imaging of the pelvis including uterus, ovaries, adnexal regions, and pelvic cul-de-sac. It was necessary to proceed with endovaginal exam following the transabdominal exam to visualize the . COMPARISON:  09/08/2008 FINDINGS: Uterus Measurements: 10.3 x 5.1 x 6.0 cm. No fibroids or other mass visualized. Endometrium Thickness: 13 mm.  No focal abnormality visualized. Right ovary Measurements: 5.6 x 2.1 x 4.1 cm. 18 mm dominant follicle noted. Left ovary Measurements: 4.2 x 2.7 x 2.2 cm. Normal appearance/no adnexal mass. Other findings Small volume simple appearing free fluid. IMPRESSION: 1. 18 mm dominant follicle in right ovary.  No acute findings. 2. Small volume simple appearing free fluid. This can be physiologic in a premenopausal female. Electronically Signed   By: Kennith Center M.D.   On: 05/13/2015 19:08   US Pelvis Complete  05/13/2015  CLINICAL DATA:  Right lower quadrant pain. EXAM: TRANSABDOMINAL AND TRANSVAGINAL ULTRASOUND OF PELVIS TECHNIQUE: Both transabdominal and transvaginal ultrasound examinations of the pelvis were performed. Transabdominal technique was performed for global imaging of the pelvis including uterus, ovaries, adnexal regions, and pelvic cul-de-sac. It was necessary to proceed with endovaginal exam following the transabdominal exam to visualize the . COMPARISON:  09/08/2008 FINDINGS: Uterus Measurements: 10.3 x 5.1 x 6.0 cm. No fibroids or other mass visualized. Endometrium Thickness: 13 mm.  No focal abnormality visualized. Right ovary Measurements: 5.6 x 2.1 x 4.1 cm. 18 mm dominant follicle noted. Left ovary Measurements: 4.2 x 2.7 x 2.2 cm. Normal appearance/no adnexal mass. Other findings Small volume simple appearing free fluid.  IMPRESSION: 1. 18 mm dominant follicle in right ovary.  No acute findings. 2. Small volume simple appearing free fluid. This can be physiologic in a premenopausal female. Electronically Signed   By: Kennith Center M.D.   On: 05/13/2015 19:08   I have personally reviewed and evaluated these images and lab results as part of my medical decision-making.   EKG Interpretation None      MDM   Final diagnoses:  Pelvic pain in female  Cyst of right ovary   Patient has normal labs and negative urine. She is not pregnant. Waxing and waning right lower quadrant pain for a number of months. Ultrasound shows a small dominant follicle. She suspects she is experiencing ovarian cysts based on the history and her pelvic examination localizing to the adnexa. Patient will be treated with ibuprofen and counseled to follow up with GYN for recheck.Arby Barrette, MD 05/13/15 1945

## 2015-05-13 NOTE — ED Notes (Signed)
Pt report RLQ pain that's worse after having sex, last menstural was x1 week, reported vaginal discharge d/t to BV per person, no fever, abd soft and tender to palpate, denies n/v/d and GU symptoms

## 2015-05-13 NOTE — Discharge Instructions (Signed)
Ovarian Cyst An ovarian cyst is a fluid-filled sac that forms on an ovary. The ovaries are small organs that produce eggs in women. Various types of cysts can form on the ovaries. Most are not cancerous. Many do not cause problems, and they often go away on their own. Some may cause symptoms and require treatment. Common types of ovarian cysts include:  Functional cysts--These cysts may occur every month during the menstrual cycle. This is normal. The cysts usually go away with the next menstrual cycle if the woman does not get pregnant. Usually, there are no symptoms with a functional cyst.  Endometrioma cysts--These cysts form from the tissue that lines the uterus. They are also called "chocolate cysts" because they become filled with blood that turns brown. This type of cyst can cause pain in the lower abdomen during intercourse and with your menstrual period.  Cystadenoma cysts--This type develops from the cells on the outside of the ovary. These cysts can get very big and cause lower abdomen pain and pain with intercourse. This type of cyst can twist on itself, cut off its blood supply, and cause severe pain. It can also easily rupture and cause a lot of pain.  Dermoid cysts--This type of cyst is sometimes found in both ovaries. These cysts may contain different kinds of body tissue, such as skin, teeth, hair, or cartilage. They usually do not cause symptoms unless they get very big.  Theca lutein cysts--These cysts occur when too much of a certain hormone (human chorionic gonadotropin) is produced and overstimulates the ovaries to produce an egg. This is most common after procedures used to assist with the conception of a baby (in vitro fertilization). CAUSES   Fertility drugs can cause a condition in which multiple large cysts are formed on the ovaries. This is called ovarian hyperstimulation syndrome.  A condition called polycystic ovary syndrome can cause hormonal imbalances that can lead to  nonfunctional ovarian cysts. SIGNS AND SYMPTOMS  Many ovarian cysts do not cause symptoms. If symptoms are present, they may include:  Pelvic pain or pressure.  Pain in the lower abdomen.  Pain during sexual intercourse.  Increasing girth (swelling) of the abdomen.  Abnormal menstrual periods.  Increasing pain with menstrual periods.  Stopping having menstrual periods without being pregnant. DIAGNOSIS  These cysts are commonly found during a routine or annual pelvic exam. Tests may be ordered to find out more about the cyst. These tests may include:  Ultrasound.  X-ray of the pelvis.  CT scan.  MRI.  Blood tests. TREATMENT  Many ovarian cysts go away on their own without treatment. Your health care provider may want to check your cyst regularly for 2-3 months to see if it changes. For women in menopause, it is particularly important to monitor a cyst closely because of the higher rate of ovarian cancer in menopausal women. When treatment is needed, it may include any of the following:  A procedure to drain the cyst (aspiration). This may be done using a long needle and ultrasound. It can also be done through a laparoscopic procedure. This involves using a thin, lighted tube with a tiny camera on the end (laparoscope) inserted through a small incision.  Surgery to remove the whole cyst. This may be done using laparoscopic surgery or an open surgery involving a larger incision in the lower abdomen.  Hormone treatment or birth control pills. These methods are sometimes used to help dissolve a cyst. HOME CARE INSTRUCTIONS   Only take over-the-counter  or prescription medicines as directed by your health care provider.  Follow up with your health care provider as directed.  Get regular pelvic exams and Pap tests. SEEK MEDICAL CARE IF:   Your periods are late, irregular, or painful, or they stop.  Your pelvic pain or abdominal pain does not go away.  Your abdomen becomes  larger or swollen.  You have pressure on your bladder or trouble emptying your bladder completely.  You have pain during sexual intercourse.  You have feelings of fullness, pressure, or discomfort in your stomach.  You lose weight for no apparent reason.  You feel generally ill.  You become constipated.  You lose your appetite.  You develop acne.  You have an increase in body and facial hair.  You are gaining weight, without changing your exercise and eating habits.  You think you are pregnant. SEEK IMMEDIATE MEDICAL CARE IF:   You have increasing abdominal pain.  You feel sick to your stomach (nauseous), and you throw up (vomit).  You develop a fever that comes on suddenly.  You have abdominal pain during a bowel movement.  Your menstrual periods become heavier than usual. MAKE SURE YOU:  Understand these instructions.  Will watch your condition.  Will get help right away if you are not doing well or get worse.   This information is not intended to replace advice given to you by your health care provider. Make sure you discuss any questions you have with your health care provider.   Document Released: 02/17/2005 Document Revised: 02/22/2013 Document Reviewed: 10/25/2012 Elsevier Interactive Patient Education 2016 Elsevier Inc. Pelvic Pain, Female Female pelvic pain can be caused by many different things and start from a variety of places. Pelvic pain refers to pain that is located in the lower half of the abdomen and between your hips. The pain may occur over a short period of time (acute) or may be reoccurring (chronic). The cause of pelvic pain may be related to disorders affecting the female reproductive organs (gynecologic), but it may also be related to the bladder, kidney stones, an intestinal complication, or muscle or skeletal problems. Getting help right away for pelvic pain is important, especially if there has been severe, sharp, or a sudden onset of  unusual pain. It is also important to get help right away because some types of pelvic pain can be life threatening.  CAUSES  Below are only some of the causes of pelvic pain. The causes of pelvic pain can be in one of several categories.   Gynecologic.  Pelvic inflammatory disease.  Sexually transmitted infection.  Ovarian cyst or a twisted ovarian ligament (ovarian torsion).  Uterine lining that grows outside the uterus (endometriosis).  Fibroids, cysts, or tumors.  Ovulation.  Pregnancy.  Pregnancy that occurs outside the uterus (ectopic pregnancy).  Miscarriage.  Labor.  Abruption of the placenta or ruptured uterus.  Infection.  Uterine infection (endometritis).  Bladder infection.  Diverticulitis.  Miscarriage related to a uterine infection (septic abortion).  Bladder.  Inflammation of the bladder (cystitis).  Kidney stone(s).  Gastrointestinal.  Constipation.  Diverticulitis.  Neurologic.  Trauma.  Feeling pelvic pain because of mental or emotional causes (psychosomatic).  Cancers of the bowel or pelvis. EVALUATION  Your caregiver will want to take a careful history of your concerns. This includes recent changes in your health, a careful gynecologic history of your periods (menses), and a sexual history. Obtaining your family history and medical history is also important. Your caregiver may suggest  a pelvic exam. A pelvic exam will help identify the location and severity of the pain. It also helps in the evaluation of which organ system may be involved. In order to identify the cause of the pelvic pain and be properly treated, your caregiver may order tests. These tests may include:   A pregnancy test.  Pelvic ultrasonography.  An X-ray exam of the abdomen.  A urinalysis or evaluation of vaginal discharge.  Blood tests. HOME CARE INSTRUCTIONS   Only take over-the-counter or prescription medicines for pain, discomfort, or fever as directed  by your caregiver.   Rest as directed by your caregiver.   Eat a balanced diet.   Drink enough fluids to make your urine clear or pale yellow, or as directed.   Avoid sexual intercourse if it causes pain.   Apply warm or cold compresses to the lower abdomen depending on which one helps the pain.   Avoid stressful situations.   Keep a journal of your pelvic pain. Write down when it started, where the pain is located, and if there are things that seem to be associated with the pain, such as food or your menstrual cycle.  Follow up with your caregiver as directed.  SEEK MEDICAL CARE IF:  Your medicine does not help your pain.  You have abnormal vaginal discharge. SEEK IMMEDIATE MEDICAL CARE IF:   You have heavy bleeding from the vagina.   Your pelvic pain increases.   You feel light-headed or faint.   You have chills.   You have pain with urination or blood in your urine.   You have uncontrolled diarrhea or vomiting.   You have a fever or persistent symptoms for more than 3 days.  You have a fever and your symptoms suddenly get worse.   You are being physically or sexually abused.   This information is not intended to replace advice given to you by your health care provider. Make sure you discuss any questions you have with your health care provider.   Document Released: 01/15/2004 Document Revised: 11/08/2014 Document Reviewed: 06/09/2011 Elsevier Interactive Patient Education [employer]2016 Elsevier Inc.

## 2015-05-13 NOTE — ED Notes (Signed)
Per pt, states right lower quadrant pain for a week, no fever, no vomiting

## 2015-05-14 LAB — GC/CHLAMYDIA PROBE AMP (~~LOC~~) NOT AT ARMC
CHLAMYDIA, DNA PROBE: NEGATIVE
NEISSERIA GONORRHEA: NEGATIVE

## 2015-06-12 ENCOUNTER — Emergency Department (HOSPITAL_COMMUNITY)
Admission: EM | Admit: 2015-06-12 | Discharge: 2015-06-13 | Disposition: A | Discharge status: Home / Self Care | Payer: Medicaid Other | Attending: Emergency Medicine | Admitting: Emergency Medicine

## 2015-06-12 ENCOUNTER — Encounter (HOSPITAL_COMMUNITY): Payer: Self-pay

## 2015-06-12 DIAGNOSIS — Z91040 Latex allergy status: Secondary | ICD-10-CM | POA: Insufficient documentation

## 2015-06-12 DIAGNOSIS — R112 Nausea with vomiting, unspecified: Secondary | ICD-10-CM | POA: Diagnosis not present

## 2015-06-12 DIAGNOSIS — R1084 Generalized abdominal pain: Secondary | ICD-10-CM | POA: Insufficient documentation

## 2015-06-12 DIAGNOSIS — Z3202 Encounter for pregnancy test, result negative: Secondary | ICD-10-CM | POA: Diagnosis not present

## 2015-06-12 DIAGNOSIS — R109 Unspecified abdominal pain: Secondary | ICD-10-CM | POA: Diagnosis present

## 2015-06-12 LAB — CBC
HCT: 31.9 % — ABNORMAL LOW (ref 36.0–46.0)
Hemoglobin: 10.5 g/dL — ABNORMAL LOW (ref 12.0–15.0)
MCH: 28.1 pg (ref 26.0–34.0)
MCHC: 32.9 g/dL (ref 30.0–36.0)
MCV: 85.3 fL (ref 78.0–100.0)
PLATELETS: 177 10*3/uL (ref 150–400)
RBC: 3.74 MIL/uL — AB (ref 3.87–5.11)
RDW: 12.5 % (ref 11.5–15.5)
WBC: 5.4 10*3/uL (ref 4.0–10.5)

## 2015-06-12 LAB — COMPREHENSIVE METABOLIC PANEL
ALBUMIN: 3.9 g/dL (ref 3.5–5.0)
ALT: 13 U/L — AB (ref 14–54)
ANION GAP: 7 (ref 5–15)
AST: 15 U/L (ref 15–41)
Alkaline Phosphatase: 30 U/L — ABNORMAL LOW (ref 38–126)
BUN: 13 mg/dL (ref 6–20)
CALCIUM: 9 mg/dL (ref 8.9–10.3)
CHLORIDE: 110 mmol/L (ref 101–111)
CO2: 24 mmol/L (ref 22–32)
CREATININE: 0.63 mg/dL (ref 0.44–1.00)
GFR calc Af Amer: >60 mL/min (ref >60)
GLUCOSE: 90 mg/dL (ref 65–99)
POTASSIUM: 3.6 mmol/L (ref 3.5–5.1)
SODIUM: 141 mmol/L (ref 135–145)
Total Bilirubin: 0.6 mg/dL (ref 0.3–1.2)
Total Protein: 6.9 g/dL (ref 6.5–8.1)

## 2015-06-12 LAB — LIPASE, BLOOD: Lipase: 28 U/L (ref 11–51)

## 2015-06-12 LAB — HCG, QUANTITATIVE, PREGNANCY

## 2015-06-12 MED ORDER — ONDANSETRON 4 MG PO TBDP
4.0000 mg | ORAL_TABLET | Freq: Once | ORAL | Status: AC | PRN
  Administered 2015-06-12: 4 mg via ORAL
  Filled 2015-06-12: qty 1

## 2015-06-12 NOTE — ED Notes (Signed)
Pt complains of abdominal pain and vomiting about two hours ago

## 2015-06-13 LAB — URINALYSIS, ROUTINE W REFLEX MICROSCOPIC
GLUCOSE, UA: NEGATIVE mg/dL
HGB URINE DIPSTICK: NEGATIVE
Ketones, ur: 40 mg/dL — AB
Leukocytes, UA: NEGATIVE
Nitrite: NEGATIVE
PH: 5.5 (ref 5.0–8.0)
PROTEIN: NEGATIVE mg/dL
SPECIFIC GRAVITY, URINE: 1.035 — AB (ref 1.005–1.030)

## 2015-06-13 MED ORDER — HYDROCODONE-ACETAMINOPHEN 5-325 MG PO TABS: 2.0000 | ORAL_TABLET | ORAL | Status: DC | PRN

## 2015-06-13 MED ORDER — DICYCLOMINE HCL 20 MG PO TABS: 20.0000 mg | ORAL_TABLET | Freq: Two times a day (BID) | ORAL | Status: DC

## 2015-06-13 MED ORDER — RANITIDINE HCL 150 MG PO TABS: 150.0000 mg | ORAL_TABLET | Freq: Two times a day (BID) | ORAL | Status: DC

## 2015-06-13 NOTE — ED Notes (Signed)
Bed: [facility] Expected date: 06/12/15 Expected time: 7:32 AM Means of arrival:  Comments:

## 2015-06-13 NOTE — ED Provider Notes (Signed)
CSN: [id number]     Arrival date & time 06/12/15  2211 History  By signing my name below, I, Budd PalmerVanessa Prueter, attest that this documentation has been prepared under the direction and in the presence of Gilda Creasehristopher J Dresden Ament, MD. Electronically Signed: Budd PalmerVanessa Prueter, ED Scribe. 06/13/2015. 1:14 AM.    Chief Complaint  Patient presents with  . Abdominal Pain   The history is provided by the patient. No language interpreter was used.   HPI Comments: Stephanie [name] is a 30 y.o. female who presents to the Emergency Department complaining of diffuse, cramping abdominal pain onset 2 hours ago. She reports associated nausea and vomiting. Pt states she was seen for the same in the ED 1 month ago and was diagnosed with a probable ovarian cyst. She states she did f/u with an OBGYN, who told her that there was nothing they could do.  Pt denies diarrhea.   History reviewed. No pertinent past medical history. History reviewed. No pertinent past surgical history. History reviewed. No pertinent family history. Social History  Substance Use Topics  . Smoking status: Never Smoker   . Smokeless tobacco: Never Used  . Alcohol Use: No   OB History    No data available     Review of Systems  Gastrointestinal: Positive for nausea, vomiting and abdominal pain. Negative for diarrhea.  All other systems reviewed and are negative.   Allergies  Latex  Home Medications   Prior to Admission medications   Medication Sig Start Date End Date Taking? Authorizing Provider  acetaminophen (TYLENOL) 500 MG tablet Take 1,000 mg by mouth every 6 (six) hours as needed for moderate pain or headache.   Yes Historical Provider, MD  dicyclomine (BENTYL) 20 MG tablet Take 1 tablet (20 mg total) by mouth 2 (two) times daily. 06/13/15   Gilda Creasehristopher J Davie Sagona, MD  HYDROcodone-acetaminophen (NORCO/VICODIN) 5-325 MG tablet Take 2 tablets by mouth every 4 (four) hours as needed for moderate pain. 06/13/15   Gilda Creasehristopher J  Karrin Eisenmenger, MD  ibuprofen (ADVIL,MOTRIN) 600 MG tablet Take 1 tablet (600 mg total) by mouth every 6 (six) hours as needed. Patient not taking: Reported on 06/12/2015 05/13/15   Arby BarretteMarcy Pfeiffer, MD  ibuprofen (ADVIL,MOTRIN) 800 MG tablet Take 1 tablet (800 mg total) by mouth every 8 (eight) hours as needed. Patient not taking: Reported on 02/12/2015 01/20/15   Ace GinsSerena Y Sam, PA-C  metroNIDAZOLE (FLAGYL) 500 MG tablet Take 1 tablet (500 mg total) by mouth 2 (two) times daily. One po bid x 7 days Patient not taking: Reported on 06/12/2015 02/12/15   Trixie DredgeEmily West, PA-C  ranitidine (ZANTAC) 150 MG tablet Take 1 tablet (150 mg total) by mouth 2 (two) times daily. 06/13/15   Gilda Creasehristopher J Rokia Bosket, MD   BP 105/71 mmHg  Pulse 82  Temp(Src) 98.3 F (36.8 C)  Resp 20  SpO2 98%  LMP 05/06/2015 Physical Exam  Constitutional: She is oriented to person, place, and time. She appears well-developed and well-nourished. No distress.  HENT:  Head: Normocephalic and atraumatic.  Right Ear: Hearing normal.  Left Ear: Hearing normal.  Nose: Nose normal.  Mouth/Throat: Oropharynx is clear and moist and mucous membranes are normal.  Eyes: Conjunctivae and EOM are normal. Pupils are equal, round, and reactive to light.  Neck: Normal range of motion. Neck supple.  Cardiovascular: Regular rhythm, S1 normal and S2 normal.  Exam reveals no gallop and no friction rub.   No murmur heard. Pulmonary/Chest: Effort normal and breath sounds normal. No  respiratory distress. She exhibits no tenderness.  Abdominal: Soft. Normal appearance and bowel sounds are normal. There is no hepatosplenomegaly. There is tenderness (diffuse). There is no rebound, no guarding, no tenderness at McBurney's point and negative Murphy's sign. No hernia.  Musculoskeletal: Normal range of motion.  Neurological: She is alert and oriented to person, place, and time. She has normal strength. No cranial nerve deficit or sensory deficit. Coordination normal.  GCS eye subscore is 4. GCS verbal subscore is 5. GCS motor subscore is 6.  Skin: Skin is warm, dry and intact. No rash noted. No cyanosis.  Psychiatric: She has a normal mood and affect. Her speech is normal and behavior is normal. Thought content normal.  Nursing note and vitals reviewed.   ED Course  Procedures  DIAGNOSTIC STUDIES: Oxygen Saturation is 98% on RA, normal by my interpretation.    COORDINATION OF CARE: 1:11 AM - Discussed plans to order a dose of Zofran and to wait on diagnostic studies. Pt advised of plan for treatment and pt agrees.  Labs Review Labs Reviewed  COMPREHENSIVE METABOLIC PANEL - Abnormal; Notable for the following:    ALT 13 (*)    Alkaline Phosphatase 30 (*)    All other components within normal limits  CBC - Abnormal; Notable for the following:    RBC 3.74 (*)    Hemoglobin 10.5 (*)    HCT 31.9 (*)    All other components within normal limits  URINALYSIS, ROUTINE W REFLEX MICROSCOPIC (NOT AT [facility]) - Abnormal; Notable for the following:    Specific Gravity, Urine 1.035 (*)    Bilirubin Urine SMALL (*)    Ketones, ur 40 (*)    All other components within normal limits  LIPASE, BLOOD  HCG, QUANTITATIVE, PREGNANCY    Imaging Review No results found. I have personally reviewed and evaluated these images and lab results as part of my medical decision-making.   EKG Interpretation None      MDM   Final diagnoses:  Generalized abdominal pain    Resents to the ER for evaluation of abdominal pain. Patient reports that she has been experiencing generalized abdominal pain for 1 day. She told the nurse that her pain was in the lower abdomen, but when examined she was diffusely tender. Patient was seen exactly one month ago for similar symptoms. At that time she was just post menarchal and was diagnosed with possible cyst. She was told to follow-up with OB/GYN, reports that they weren't able to do anything for her. Abdominal exam is benign. No focal  tenderness. No signs of acute surgical process. Lab work normal. Vital signs normal. Patient to be discharged with analgesia.  I personally performed the services described in this documentation, which was scribed in my presence. The recorded information has been reviewed and is accurate.   Gilda Crease, MD 06/13/15 [phone]

## 2015-06-13 NOTE — ED Notes (Signed)
Pt has complaints of lower abdominal pain and vomiting with 2 episodes of emesis in the past 2 hours. Pt denies urinary symptoms nor diarrhea.

## 2015-06-13 NOTE — Discharge Instructions (Signed)

## 2015-06-13 NOTE — ED Notes (Signed)
Pt was offered PO fluids

## 2015-07-03 ENCOUNTER — Inpatient Hospital Stay (HOSPITAL_COMMUNITY)
Admission: AD | Admit: 2015-07-03 | Discharge: 2015-07-04 | Disposition: A | Discharge status: Home / Self Care | Payer: Medicaid Other | Source: Ambulatory Visit | Attending: Family Medicine | Admitting: Family Medicine

## 2015-07-03 DIAGNOSIS — Z3A08 8 weeks gestation of pregnancy: Secondary | ICD-10-CM | POA: Insufficient documentation

## 2015-07-03 DIAGNOSIS — N644 Mastodynia: Secondary | ICD-10-CM | POA: Insufficient documentation

## 2015-07-03 DIAGNOSIS — Z3201 Encounter for pregnancy test, result positive: Secondary | ICD-10-CM

## 2015-07-03 DIAGNOSIS — R110 Nausea: Secondary | ICD-10-CM | POA: Insufficient documentation

## 2015-07-03 DIAGNOSIS — O26891 Other specified pregnancy related conditions, first trimester: Secondary | ICD-10-CM | POA: Insufficient documentation

## 2015-07-04 ENCOUNTER — Encounter (HOSPITAL_COMMUNITY): Payer: Self-pay

## 2015-07-04 DIAGNOSIS — O26891 Other specified pregnancy related conditions, first trimester: Secondary | ICD-10-CM | POA: Diagnosis present

## 2015-07-04 DIAGNOSIS — N644 Mastodynia: Secondary | ICD-10-CM | POA: Diagnosis not present

## 2015-07-04 DIAGNOSIS — O219 Vomiting of pregnancy, unspecified: Secondary | ICD-10-CM

## 2015-07-04 DIAGNOSIS — Z3A08 8 weeks gestation of pregnancy: Secondary | ICD-10-CM | POA: Diagnosis not present

## 2015-07-04 DIAGNOSIS — Z3201 Encounter for pregnancy test, result positive: Secondary | ICD-10-CM

## 2015-07-04 DIAGNOSIS — R110 Nausea: Secondary | ICD-10-CM | POA: Diagnosis not present

## 2015-07-04 LAB — POCT PREGNANCY, URINE: Preg Test, Ur: POSITIVE — AB

## 2015-07-04 MED ORDER — PRENATAL COMPLETE 14-0.4 MG PO TABS: 1.0000 | ORAL_TABLET | Freq: Every day | ORAL | Status: DC

## 2015-07-04 MED ORDER — PROMETHAZINE HCL 12.5 MG PO TABS: 12.5000 mg | ORAL_TABLET | Freq: Four times a day (QID) | ORAL | Status: DC | PRN

## 2015-07-04 NOTE — MAU Provider Note (Signed)
History     CSN: [id number]  Arrival date and time: 07/03/15 2319   First Provider Initiated Contact with Patient 07/04/15 0029      Chief Complaint  Patient presents with  . Breast Pain  . Nausea   HPI Stephanie [name] is a 30 y.o. G1P0 at 5894w3d who presents to MAU today with complaint of nausea and breast tenderness. The patient states a history of irregular periods. LMP 05/06/15. She states breast tenderness and nausea without vomiting x 1 week. She denies abdominal pain, vaginal bleeding, discharge, UTI symptoms, fever, diarrhea or constipation. She has not taken HPT.   OB History    Gravida Para Term Preterm AB TAB SAB Ectopic Multiple Living   1               No past medical history on file.  No past surgical history on file.  No family history on file.  Social History  Substance Use Topics  . Smoking status: Never Smoker   . Smokeless tobacco: Never Used  . Alcohol Use: No    Allergies:  Allergies  Allergen Reactions  . Latex Itching    Prescriptions prior to admission  Medication Sig Dispense Refill Last Dose  . acetaminophen (TYLENOL) 500 MG tablet Take 1,000 mg by mouth every 6 (six) hours as needed for moderate pain or headache.   Past Month at Unknown time  . dicyclomine (BENTYL) 20 MG tablet Take 1 tablet (20 mg total) by mouth 2 (two) times daily. 20 tablet 0   . HYDROcodone-acetaminophen (NORCO/VICODIN) 5-325 MG tablet Take 2 tablets by mouth every 4 (four) hours as needed for moderate pain. 10 tablet 0   . ibuprofen (ADVIL,MOTRIN) 600 MG tablet Take 1 tablet (600 mg total) by mouth every 6 (six) hours as needed. (Patient not taking: Reported on 06/12/2015) 30 tablet 0 Completed Course at Unknown time  . ibuprofen (ADVIL,MOTRIN) 800 MG tablet Take 1 tablet (800 mg total) by mouth every 8 (eight) hours as needed. (Patient not taking: Reported on 02/12/2015) 30 tablet 0 Completed Course at Unknown time  . metroNIDAZOLE (FLAGYL) 500 MG tablet Take 1  tablet (500 mg total) by mouth 2 (two) times daily. One po bid x 7 days (Patient not taking: Reported on 06/12/2015) 14 tablet 0 Completed Course at Unknown time  . ranitidine (ZANTAC) 150 MG tablet Take 1 tablet (150 mg total) by mouth 2 (two) times daily. 60 tablet 0     Review of Systems  Constitutional: Negative for fever and malaise/fatigue.  Gastrointestinal: Positive for nausea. Negative for vomiting, abdominal pain, diarrhea and constipation.  Genitourinary: Negative for dysuria, urgency and frequency.       Neg - vaginal bleeding, discharge   Physical Exam   Blood pressure 109/77, pulse 72, temperature 98 F (36.7 C), resp. rate 18, height 5' (1.524 m), weight 147 lb (66.679 kg), last menstrual period 05/06/2015, SpO2 100 %.  Physical Exam  Nursing note and vitals reviewed. Constitutional: She is oriented to person, place, and time. She appears well-developed and well-nourished. No distress.  HENT:  Head: Normocephalic and atraumatic.  Cardiovascular: Normal rate.   Respiratory: Effort normal.  GI: Soft. She exhibits no distension.  Neurological: She is alert and oriented to person, place, and time.  Skin: Skin is warm and dry. No erythema.  Psychiatric: She has a normal mood and affect.   Results for orders placed or performed during the hospital encounter of 07/03/15 (from the past 24 hour(s))  Pregnancy, urine POC     Status: Abnormal   Collection Time: 07/04/15 12:23 AM  Result Value Ref Range   Preg Test, Ur POSITIVE (A) NEGATIVE    MAU Course  Procedures None  MDM + UPT Patient denies abdominal pain or vaginal bleeding today.   Assessment and Plan  A: Positive pregnancy test Nausea without vomiting  P: Discharge home Rx for Phenergan and PNV given to patient  First trimester precautions discussed Patient advised to follow-up with OB provider of choice. Pregnancy confirmation letter given with list of area OB providers. Patient may return to MAU as  needed or if her condition were to change or worsen   Marny Lowenstein, PA-C  07/04/2015, 12:29 AM

## 2015-07-04 NOTE — Discharge Instructions (Signed)
First Trimester of Pregnancy The first trimester of pregnancy is from week 1 until the end of week 12 (months 1 through 3). During this time, your baby will begin to develop inside you. At 6-8 weeks, the eyes and face are formed, and the heartbeat can be seen on ultrasound. At the end of 12 weeks, all the baby's organs are formed. Prenatal care is all the medical care you receive before the birth of your baby. Make sure you get good prenatal care and follow all of your doctor's instructions. HOME CARE  Medicines  Take medicine only as told by your doctor. Some medicines are safe and some are not during pregnancy.  Take your prenatal vitamins as told by your doctor.  Take medicine that helps you poop (stool softener) as needed if your doctor says it is okay. Diet  Eat regular, healthy meals.  Your doctor will tell you the amount of weight gain that is right for you.  Avoid raw meat and uncooked cheese.  If you feel sick to your stomach (nauseous) or throw up (vomit):  Eat 4 or 5 small meals a day instead of 3 large meals.  Try eating a few soda crackers.  Drink liquids between meals instead of during meals.  If you have a hard time pooping (constipation):  Eat high-fiber foods like fresh vegetables, fruit, and whole grains.  Drink enough fluids to keep your pee (urine) clear or pale yellow. Activity and Exercise  Exercise only as told by your doctor. Stop exercising if you have cramps or pain in your lower belly (abdomen) or low back.  Try to avoid standing for long periods of time. Move your legs often if you must stand in one place for a long time.  Avoid heavy lifting.  Wear low-heeled shoes. Sit and stand up straight.  You can have sex unless your doctor tells you not to. Relief of Pain or Discomfort  Wear a good support bra if your breasts are sore.  Take warm water baths (sitz baths) to soothe pain or discomfort caused by hemorrhoids. Use hemorrhoid cream if your  doctor says it is okay.  Rest with your legs raised if you have leg cramps or low back pain.  Wear support hose if you have puffy, bulging veins (varicose veins) in your legs. Raise (elevate) your feet for 15 minutes, 3-4 times a day. Limit salt in your diet. Prenatal Care  Schedule your prenatal visits by the twelfth week of pregnancy.  Write down your questions. Take them to your prenatal visits.  Keep all your prenatal visits as told by your doctor. Safety  Wear your seat belt at all times when driving.  Make a list of emergency phone numbers. The list should include numbers for family, friends, the hospital, and police and fire departments. General Tips  Ask your doctor for a referral to a local prenatal class. Begin classes no later than at the start of month 6 of your pregnancy.  Ask for help if you need counseling or help with nutrition. Your doctor can give you advice or tell you where to go for help.  Do not use hot tubs, steam rooms, or saunas.  Do not douche or use tampons or scented sanitary pads.  Do not cross your legs for long periods of time.  Avoid litter boxes and soil used by cats.  Avoid all smoking, herbs, and alcohol. Avoid drugs not approved by your doctor.  Do not use any tobacco products, including cigarettes,  chewing tobacco, and electronic cigarettes. If you need help quitting, ask your doctor. You may get counseling or other support to help you quit.  Visit your dentist. At home, brush your teeth with a soft toothbrush. Be gentle when you floss. GET HELP IF:  You are dizzy.  You have mild cramps or pressure in your lower belly.  You have a nagging pain in your belly area.  You continue to feel sick to your stomach, throw up, or have watery poop (diarrhea).  You have a bad smelling fluid coming from your vagina.  You have pain with peeing (urination).  You have increased puffiness (swelling) in your face, hands, legs, or ankles. GET HELP  RIGHT AWAY IF:   You have a fever.  You are leaking fluid from your vagina.  You have spotting or bleeding from your vagina.  You have very bad belly cramping or pain.  You gain or lose weight rapidly.  You throw up blood. It may look like coffee grounds.  You are around people who have [place] measles, fifth disease, or chickenpox.  You have a very bad headache.  You have shortness of breath.  You have any kind of trauma, such as from a fall or a car accident.   This information is not intended to replace advice given to you by your health care provider. Make sure you discuss any questions you have with your health care provider.   Document Released: 08/06/2007 Document Revised: 03/10/2014 Document Reviewed: 12/28/2012 Elsevier Interactive Patient Education 2016 [employer] Inc[employer] Morning Sickness Morning sickness is when you feel sick to your stomach (nauseous) during pregnancy. You may feel sick to your stomach and throw up (vomit). You may feel sick in the morning, but you can feel this way any time of day. Some women feel very sick to their stomach and cannot stop throwing up (hyperemesis gravidarum). HOME CARE  Only take medicines as told by your doctor.  Take multivitamins as told by your doctor. Taking multivitamins before getting pregnant can stop or lessen the harshness of morning sickness.  Eat dry toast or unsalted crackers before getting out of bed.  Eat 5 to 6 small meals a day.  Eat dry and bland foods like rice and baked potatoes.  Do not drink liquids with meals. Drink between meals.  Do not eat greasy, fatty, or spicy foods.  Have someone cook for you if the smell of food causes you to feel sick or throw up.  If you feel sick to your stomach after taking prenatal vitamins, take them at night or with a snack.  Eat protein when you need a snack (nuts, yogurt, cheese).  Eat unsweetened gelatins for dessert.  Wear a bracelet used for sea sickness  (acupressure wristband).  Go to a doctor that puts thin needles into certain body points (acupuncture) to improve how you feel.  Do not smoke.  Use a humidifier to keep the air in your house free of odors.  Get lots of fresh air. GET HELP IF:  You need medicine to feel better.  You feel dizzy or lightheaded.  You are losing weight. GET HELP RIGHT AWAY IF:   You feel very sick to your stomach and cannot stop throwing up.  You pass out (faint). MAKE SURE YOU:  Understand these instructions.  Will watch your condition.  Will get help right away if you are not doing well or get worse.   This information is not intended to replace advice given to  you by your health care provider. Make sure you discuss any questions you have with your health care provider.   Document Released: 03/27/2004 Document Revised: 03/10/2014 Document Reviewed: 08/04/2012 Elsevier Interactive Patient Education 2016 [employer]. Eating Plan for Hyperemesis Gravidarum Severe cases of hyperemesis gravidarum can lead to dehydration and malnutrition. The hyperemesis eating plan is one way to lessen the symptoms of nausea and vomiting. It is often used with prescribed medicines to control your symptoms.  WHAT CAN I DO TO RELIEVE MY SYMPTOMS? Listen to your body. Everyone is different and has different preferences. Find what works best for you. Some of the following things may help:  Eat and drink slowly.  Eat 5-6 small meals daily instead of 3 large meals.   Eat crackers before you get out of bed in the morning.   Starchy foods are usually well tolerated (such as cereal, toast, bread, potatoes, pasta, rice, and pretzels).   Ginger may help with nausea. Add  tsp ground ginger to hot tea or choose ginger tea.   Try drinking 100% fruit juice or an electrolyte drink.  Continue to take your prenatal vitamins as directed by your health care provider. If you are having trouble taking your prenatal  vitamins, talk with your health care provider about different options.  Include at least 1 serving of protein with your meals and snacks (such as meats or poultry, beans, nuts, eggs, or yogurt). Try eating a protein-rich snack before bed (such as cheese and crackers or a half [place] or peanut butter sandwich). WHAT THINGS SHOULD I AVOID TO REDUCE MY SYMPTOMS? The following things may help reduce your symptoms:  Avoid foods with strong smells. Try eating meals in well-ventilated areas that are free of odors.  Avoid drinking water or other beverages with meals. Try not to drink anything less than 30 minutes before and after meals.  Avoid drinking more than 1 cup of fluid at a time.  Avoid fried or high-fat foods, such as butter and cream sauces.  Avoid spicy foods.  Avoid skipping meals the best you can. Nausea can be more intense on an empty stomach. If you cannot tolerate food at that time, do not force it. Try sucking on ice chips or other frozen items and make up the calories later.  Avoid lying down within 2 hours after eating.   This information is not intended to replace advice given to you by your health care provider. Make sure you discuss any questions you have with your health care provider.   Document Released: 12/15/2006 Document Revised: 02/22/2013 Document Reviewed: 12/22/2012 Elsevier Interactive Patient Education [employer]

## 2015-07-04 NOTE — MAU Note (Signed)
Pt c/o breast pain that started about a week ago. Had some nausea this morning and vomited once. Denies vaginal bleeding. Denies abdominal pain. LMP: unsure due to irregular periods.

## 2015-07-06 ENCOUNTER — Inpatient Hospital Stay (HOSPITAL_COMMUNITY): Payer: Medicaid Other

## 2015-07-06 ENCOUNTER — Inpatient Hospital Stay (HOSPITAL_COMMUNITY)
Admission: AD | Admit: 2015-07-06 | Discharge: 2015-07-06 | Disposition: A | Discharge status: Home / Self Care | Payer: Medicaid Other | Source: Ambulatory Visit | Attending: Obstetrics & Gynecology | Admitting: Obstetrics & Gynecology

## 2015-07-06 ENCOUNTER — Encounter (HOSPITAL_COMMUNITY): Payer: Self-pay | Admitting: *Deleted

## 2015-07-06 DIAGNOSIS — Z8759 Personal history of other complications of pregnancy, childbirth and the puerperium: Secondary | ICD-10-CM | POA: Diagnosis not present

## 2015-07-06 DIAGNOSIS — O9989 Other specified diseases and conditions complicating pregnancy, childbirth and the puerperium: Secondary | ICD-10-CM

## 2015-07-06 DIAGNOSIS — O26899 Other specified pregnancy related conditions, unspecified trimester: Secondary | ICD-10-CM

## 2015-07-06 DIAGNOSIS — O26891 Other specified pregnancy related conditions, first trimester: Secondary | ICD-10-CM | POA: Insufficient documentation

## 2015-07-06 DIAGNOSIS — Z91040 Latex allergy status: Secondary | ICD-10-CM | POA: Insufficient documentation

## 2015-07-06 DIAGNOSIS — O3680X0 Pregnancy with inconclusive fetal viability, not applicable or unspecified: Secondary | ICD-10-CM

## 2015-07-06 DIAGNOSIS — Z3A08 8 weeks gestation of pregnancy: Secondary | ICD-10-CM | POA: Insufficient documentation

## 2015-07-06 DIAGNOSIS — R109 Unspecified abdominal pain: Secondary | ICD-10-CM | POA: Insufficient documentation

## 2015-07-06 HISTORY — DX: Anemia, unspecified: D64.9

## 2015-07-06 LAB — URINALYSIS, ROUTINE W REFLEX MICROSCOPIC
Bilirubin Urine: NEGATIVE
Glucose, UA: NEGATIVE mg/dL
Hgb urine dipstick: NEGATIVE
Ketones, ur: NEGATIVE mg/dL
Leukocytes, UA: NEGATIVE
Nitrite: NEGATIVE
Protein, ur: NEGATIVE mg/dL
Specific Gravity, Urine: 1.025 (ref 1.005–1.030)
pH: 6 (ref 5.0–8.0)

## 2015-07-06 LAB — CBC
HCT: 29.9 % — ABNORMAL LOW (ref 36.0–46.0)
Hemoglobin: 9.9 g/dL — ABNORMAL LOW (ref 12.0–15.0)
MCH: 27.3 pg (ref 26.0–34.0)
MCHC: 33.1 g/dL (ref 30.0–36.0)
MCV: 82.6 fL (ref 78.0–100.0)
Platelets: 196 10*3/uL (ref 150–400)
RBC: 3.62 MIL/uL — ABNORMAL LOW (ref 3.87–5.11)
RDW: 12.7 % (ref 11.5–15.5)
WBC: 6.8 10*3/uL (ref 4.0–10.5)

## 2015-07-06 LAB — HCG, QUANTITATIVE, PREGNANCY: hCG, Beta Chain, Quant, S: 21554 m[IU]/mL — ABNORMAL HIGH (ref <5)

## 2015-07-06 NOTE — MAU Note (Signed)
abd pain started last night.  Hx of tubal preg and ovarian cyst.  Unsure of LMP, but was concerned with the pain due to hx.

## 2015-07-06 NOTE — Discharge Instructions (Signed)

## 2015-07-06 NOTE — MAU Provider Note (Signed)
History     CSN: [id number]  Arrival date and time: 07/06/15 1445   First Provider Initiated Contact with Patient 07/06/15 1546      Chief Complaint  Patient presents with  . Abdominal Pain   HPI  Stephanie [name] 30 y.o. [id number] @ 3882w5d presents to the MAU stating she is having abdominal pain on her right lower abdomen. She has a history of 2 ectopic pregancies. Denies vaginal bleeding.  Past Medical History  Diagnosis Date  . Anemia     Past Surgical History  Procedure Laterality Date  . No past surgeries      No family history on file.  Social History  Substance Use Topics  . Smoking status: Never Smoker   . Smokeless tobacco: Never Used  . Alcohol Use: No    Allergies:  Allergies  Allergen Reactions  . Latex Itching    Prescriptions prior to admission  Medication Sig Dispense Refill Last Dose  . acetaminophen (TYLENOL) 500 MG tablet Take 1,000 mg by mouth every 6 (six) hours as needed for moderate pain or headache.   Past Month at Unknown time  . dicyclomine (BENTYL) 20 MG tablet Take 1 tablet (20 mg total) by mouth 2 (two) times daily. 20 tablet 0   . Prenatal Vit-Fe Fumarate-FA (PRENATAL COMPLETE) 14-0.4 MG TABS Take 1 tablet by mouth daily. 60 each 6   . promethazine (PHENERGAN) 12.5 MG tablet Take 1 tablet (12.5 mg total) by mouth every 6 (six) hours as needed for nausea or vomiting. 30 tablet 0   . ranitidine (ZANTAC) 150 MG tablet Take 1 tablet (150 mg total) by mouth 2 (two) times daily. 60 tablet 0     Review of Systems  Constitutional: Negative for fever.  Gastrointestinal: Positive for abdominal pain.  All other systems reviewed and are negative.  Physical Exam   Blood pressure 119/76, pulse 73, temperature 98.3 F (36.8 C), temperature source Oral, resp. rate 18, height 5' 1.5" (1.562 m), weight 148 lb 3.2 oz (67.223 kg), last menstrual period 05/06/2015.  Physical Exam  Nursing note and vitals reviewed. Constitutional: She is oriented to  person, place, and time. She appears well-developed and well-nourished.  HENT:  Head: Normocephalic.  Cardiovascular: Normal rate.   Respiratory: Effort normal. No respiratory distress.  GI: Soft. There is no tenderness.  Neurological: She is alert and oriented to person, place, and time.  Skin: Skin is warm and dry.  Psychiatric: She has a normal mood and affect. Her behavior is normal. Judgment and thought content normal.   Results for orders placed or performed during the hospital encounter of 07/06/15 (from the past 24 hour(s))  Urinalysis, Routine w reflex microscopic (not [facility])     Status: None   Collection Time: 07/06/15  3:00 PM  Result Value Ref Range   Color, Urine YELLOW YELLOW   APPearance CLEAR CLEAR   Specific Gravity, Urine 1.025 1.005 - 1.030   pH 6.0 5.0 - 8.0   Glucose, UA NEGATIVE NEGATIVE mg/dL   Hgb urine dipstick NEGATIVE NEGATIVE   Bilirubin Urine NEGATIVE NEGATIVE   Ketones, ur NEGATIVE NEGATIVE mg/dL   Protein, ur NEGATIVE NEGATIVE mg/dL   Nitrite NEGATIVE NEGATIVE   Leukocytes, UA NEGATIVE NEGATIVE  CBC     Status: Abnormal   Collection Time: 07/06/15  4:05 PM  Result Value Ref Range   WBC 6.8 4.0 - 10.5 K/uL   RBC 3.62 (L) 3.87 - 5.11 MIL/uL   Hemoglobin 9.9 (L) 12.0 -  15.0 g/dL   HCT 60.4 (L) 54.0 - 98.1 %   MCV 82.6 78.0 - 100.0 fL   MCH 27.3 26.0 - 34.0 pg   MCHC 33.1 30.0 - 36.0 g/dL   RDW 19.1 47.8 - 29.5 %   Platelets 196 150 - 400 K/uL  hCG, quantitative, pregnancy     Status: Abnormal   Collection Time: 07/06/15  4:05 PM  Result Value Ref Range   hCG, Beta Chain, Quant, S 62130 (H) <5 mIU/mL   Results for orders placed or performed during the hospital encounter of 07/06/15 (from the past 24 hour(s))  Urinalysis, Routine w reflex microscopic (not at [facility])     Status: None   Collection Time: 07/06/15  3:00 PM  Result Value Ref Range   Color, Urine YELLOW YELLOW   APPearance CLEAR CLEAR   Specific Gravity, Urine 1.025 1.005 - 1.030    pH 6.0 5.0 - 8.0   Glucose, UA NEGATIVE NEGATIVE mg/dL   Hgb urine dipstick NEGATIVE NEGATIVE   Bilirubin Urine NEGATIVE NEGATIVE   Ketones, ur NEGATIVE NEGATIVE mg/dL   Protein, ur NEGATIVE NEGATIVE mg/dL   Nitrite NEGATIVE NEGATIVE   Leukocytes, UA NEGATIVE NEGATIVE  CBC     Status: Abnormal   Collection Time: 07/06/15  4:05 PM  Result Value Ref Range   WBC 6.8 4.0 - 10.5 K/uL   RBC 3.62 (L) 3.87 - 5.11 MIL/uL   Hemoglobin 9.9 (L) 12.0 - 15.0 g/dL   HCT 86.5 (L) 78.4 - 69.6 %   MCV 82.6 78.0 - 100.0 fL   MCH 27.3 26.0 - 34.0 pg   MCHC 33.1 30.0 - 36.0 g/dL   RDW 29.5 28.4 - 13.2 %   Platelets 196 150 - 400 K/uL  hCG, quantitative, pregnancy     Status: Abnormal   Collection Time: 07/06/15  4:05 PM  Result Value Ref Range   hCG, Beta Chain, Quant, S 44010 (H) <5 mIU/mL  US Ob Comp Less 14 Wks  07/06/2015  CLINICAL DATA:  Pregnant patient with abdominal pain. Prior ectopic pregnancy. EXAM: OBSTETRIC <14 WK [place] AND TRANSVAGINAL OB US TECHNIQUE: Both transabdominal and transvaginal ultrasound examinations were performed for complete evaluation of the gestation as well as the maternal uterus, adnexal regions, and pelvic cul-de-sac. Transvaginal technique was performed to assess early pregnancy. COMPARISON:  Pelvic ultrasound 05/13/2015 FINDINGS: Intrauterine gestational sac: Present Yolk sac:  Probable Embryo:  Not present Cardiac Activity: Not present MSD: 11.8  mm   6 w   0  d Subchorionic hemorrhage:  Moderate subchorionic hemorrhage Maternal uterus/adnexae: Probable corpus luteum right ovary. Normal left ovary. Small amount of free fluid in the pelvis. IMPRESSION: Probable early intrauterine gestational sac and yolk sac without fetal pole or cardiac activity yet visualized. Recommend follow-up quantitative B-HCG levels and follow-up US in 14 days to confirm and assess viability. This recommendation follows SRU consensus guidelines: Diagnostic Criteria for Nonviable Pregnancy Early in the  First Trimester. Malva Limes Med 2013; 272:5366-44. Moderate subchorionic hemorrhage. Electronically Signed   By: Annia Belt M.D.   On: 07/06/2015 17:58   US Ob Transvaginal  07/06/2015  CLINICAL DATA:  Pregnant patient with abdominal pain. Prior ectopic pregnancy. EXAM: OBSTETRIC <14 WK [place] AND TRANSVAGINAL OB US TECHNIQUE: Both transabdominal and transvaginal ultrasound examinations were performed for complete evaluation of the gestation as well as the maternal uterus, adnexal regions, and pelvic cul-de-sac. Transvaginal technique was performed to assess early pregnancy. COMPARISON:  Pelvic ultrasound 05/13/2015 FINDINGS: Intrauterine  gestational sac: Present Yolk sac:  Probable Embryo:  Not present Cardiac Activity: Not present MSD: 11.8  mm   6 w   0  d Subchorionic hemorrhage:  Moderate subchorionic hemorrhage Maternal uterus/adnexae: Probable corpus luteum right ovary. Normal left ovary. Small amount of free fluid in the pelvis. IMPRESSION: Probable early intrauterine gestational sac and yolk sac without fetal pole or cardiac activity yet visualized. Recommend follow-up quantitative B-HCG levels and follow-up US in 14 days to confirm and assess viability. This recommendation follows SRU consensus guidelines: Diagnostic Criteria for Nonviable Pregnancy Early in the First Trimester. Malva Limes Med 2013; 161:0960-45. Moderate subchorionic hemorrhage. Electronically Signed   By: Annia Belt M.D.   On: 07/06/2015 17:58   MAU Course  Procedures  MDM Given pt's reported history of 2 ectopic pregnancies; she will be worked up for possible ectopic pregnancy. After reviewing labs and u/s pt should be followed for beta hcg for appropriate rise. She will need to repeat her beta hcg in the MAU on Sunday afternoon because the clinic will be closed. She will need a follow up ultrasound in approximately 1 week.  Assessment and Plan  Pregnancy of unknown location Follow up in MAU on Sunday for repeat labs Follow up  ultrasound in 1 week Return to MAU as needed Ectopic precautions reviewed Discharge to home    [facility] [place] 07/06/2015, 3:54 PM

## 2015-07-08 ENCOUNTER — Encounter (HOSPITAL_COMMUNITY): Payer: Self-pay | Admitting: *Deleted

## 2015-07-08 ENCOUNTER — Inpatient Hospital Stay (HOSPITAL_COMMUNITY)
Admission: AD | Admit: 2015-07-08 | Discharge: 2015-07-08 | Disposition: A | Discharge status: Home / Self Care | Payer: Medicaid Other | Source: Ambulatory Visit | Attending: Obstetrics & Gynecology | Admitting: Obstetrics & Gynecology

## 2015-07-08 DIAGNOSIS — O0281 Inappropriate change in quantitative human chorionic gonadotropin (hCG) in early pregnancy: Secondary | ICD-10-CM | POA: Diagnosis not present

## 2015-07-08 DIAGNOSIS — Z3481 Encounter for supervision of other normal pregnancy, first trimester: Secondary | ICD-10-CM | POA: Insufficient documentation

## 2015-07-08 DIAGNOSIS — O3680X0 Pregnancy with inconclusive fetal viability, not applicable or unspecified: Secondary | ICD-10-CM

## 2015-07-08 LAB — HCG, QUANTITATIVE, PREGNANCY: hCG, Beta Chain, Quant, S: 37025 m[IU]/mL — ABNORMAL HIGH (ref <5)

## 2015-07-08 NOTE — MAU Note (Signed)
Pt. Called, not in lobby.

## 2015-07-08 NOTE — MAU Note (Signed)
Pt presents to MAU for repeat BHCG. Pt denies any vaginal bleeding or pain

## 2015-07-08 NOTE — MAU Provider Note (Signed)
[name]  is a 30 y.o. [id number] at 6971w2d who [facility] today for follow-up quant hCG after 48 hours. The patient was seen in MAU on 07/06/15 and had quant hCG of [id number] and US showed a gestational sac and  probable yolk sac. She denies/endorses no pain, vaginal bleeding or fever today.   OB History  Gravida Para Term Preterm AB SAB TAB Ectopic Multiple Living  5 2 2  2   2  2     # Outcome Date GA Lbr Len/2nd Weight Sex Delivery Anes PTL Lv  5 Current           4 Term           3 Term           2 Ectopic           1 Ectopic               Past Medical History  Diagnosis Date  . Anemia      BP 119/69 mmHg  Pulse 77  Temp(Src) 98.1 F (36.7 C)  Resp 18  LMP 05/06/2015  CONSTITUTIONAL: Well-developed, well-nourished female in no acute distress.  ENT: External right and left ear normal.  EYES: EOM intact, conjunctivae normal.  MUSCULOSKELETAL: Normal range of motion.  CARDIOVASCULAR: Regular heart rate RESPIRATORY: Normal effort NEUROLOGICAL: Alert and oriented to person, place, and time.  SKIN: Skin is warm and dry. No rash noted. Not diaphoretic. No erythema. No pallor. PSYCH: Normal mood and affect. Normal behavior. Normal judgment and thought content.     Results for orders placed or performed during the hospital encounter of 07/08/15 (from the past 24 hour(s))  hCG, quantitative, pregnancy     Status: Abnormal   Collection Time: 07/08/15  3:38 PM  Result Value Ref Range   hCG, Beta Chain, Quant, S 37025 (H) <5 mIU/mL  A. inappropriate rise in quant hCG after 48 hours  P: Discharge home First trimester/ectopic precautions discussed Patient will return for follow-up US in 1 week. Order placed and RN to schedule. [facility] and for results following US.  Patient may return to MAU as needed or if her condition were to change or worsen   Rhea PinkLori A Clemmons, CNM 07/08/2015 5:06 PM

## 2015-07-08 NOTE — Discharge Instructions (Signed)

## 2015-07-10 ENCOUNTER — Other Ambulatory Visit: Payer: Medicaid Other

## 2015-07-10 DIAGNOSIS — Z3490 Encounter for supervision of normal pregnancy, unspecified, unspecified trimester: Secondary | ICD-10-CM

## 2015-07-10 NOTE — Progress Notes (Signed)
Per Dr. Macon LargeAnyanwu pt does not need repeat hcg just a repeat ultrasound in the next few days. Patient informed and voiced understanding.

## 2015-07-12 ENCOUNTER — Ambulatory Visit (HOSPITAL_COMMUNITY)
Admission: RE | Admit: 2015-07-12 | Discharge: 2015-07-12 | Disposition: A | Discharge status: Home / Self Care | Payer: Medicaid Other | Source: Ambulatory Visit | Attending: Obstetrics & Gynecology | Admitting: Obstetrics & Gynecology

## 2015-07-12 ENCOUNTER — Other Ambulatory Visit: Payer: Self-pay

## 2015-07-12 DIAGNOSIS — Z3A01 Less than 8 weeks gestation of pregnancy: Secondary | ICD-10-CM | POA: Insufficient documentation

## 2015-07-12 DIAGNOSIS — O3680X0 Pregnancy with inconclusive fetal viability, not applicable or unspecified: Secondary | ICD-10-CM

## 2015-07-12 DIAGNOSIS — O283 Abnormal ultrasonic finding on antenatal screening of mother: Secondary | ICD-10-CM | POA: Diagnosis present

## 2015-07-16 ENCOUNTER — Telehealth: Payer: Self-pay

## 2015-07-16 NOTE — Telephone Encounter (Signed)
Pt has been notified of normal IUP and to start prenatal vitamins. Pt will start care with the health department once she receive her insurance card.

## 2015-07-27 LAB — OB RESULTS CONSOLE HIV ANTIBODY (ROUTINE TESTING): HIV: NONREACTIVE

## 2015-07-27 LAB — OB RESULTS CONSOLE RUBELLA ANTIBODY, IGM: RUBELLA: IMMUNE

## 2015-07-27 LAB — OB RESULTS CONSOLE HEPATITIS B SURFACE ANTIGEN: Hepatitis B Surface Ag: NEGATIVE

## 2015-07-27 LAB — OB RESULTS CONSOLE RPR: RPR: NONREACTIVE

## 2015-08-01 ENCOUNTER — Encounter: Payer: Self-pay | Admitting: Obstetrics & Gynecology

## 2015-08-01 ENCOUNTER — Encounter: Payer: Medicaid Other | Admitting: Certified Nurse Midwife

## 2015-11-03 ENCOUNTER — Inpatient Hospital Stay (HOSPITAL_COMMUNITY)
Admission: AD | Admit: 2015-11-03 | Discharge: 2015-11-03 | Disposition: A | Discharge status: Home / Self Care | Payer: Medicaid Other | Source: Ambulatory Visit | Attending: Obstetrics & Gynecology | Admitting: Obstetrics & Gynecology

## 2015-11-03 ENCOUNTER — Encounter (HOSPITAL_COMMUNITY): Payer: Self-pay

## 2015-11-03 DIAGNOSIS — O26892 Other specified pregnancy related conditions, second trimester: Secondary | ICD-10-CM | POA: Insufficient documentation

## 2015-11-03 DIAGNOSIS — F329 Major depressive disorder, single episode, unspecified: Secondary | ICD-10-CM | POA: Diagnosis not present

## 2015-11-03 DIAGNOSIS — O99342 Other mental disorders complicating pregnancy, second trimester: Secondary | ICD-10-CM | POA: Diagnosis not present

## 2015-11-03 DIAGNOSIS — Z91040 Latex allergy status: Secondary | ICD-10-CM | POA: Diagnosis not present

## 2015-11-03 DIAGNOSIS — Z3A22 22 weeks gestation of pregnancy: Secondary | ICD-10-CM | POA: Insufficient documentation

## 2015-11-03 DIAGNOSIS — R102 Pelvic and perineal pain: Secondary | ICD-10-CM | POA: Diagnosis not present

## 2015-11-03 LAB — URINE MICROSCOPIC-ADD ON

## 2015-11-03 LAB — URINALYSIS, ROUTINE W REFLEX MICROSCOPIC
Bilirubin Urine: NEGATIVE
GLUCOSE, UA: NEGATIVE mg/dL
Ketones, ur: 15 mg/dL — AB
Nitrite: NEGATIVE
Protein, ur: NEGATIVE mg/dL
SPECIFIC GRAVITY, URINE: 1.02 (ref 1.005–1.030)
pH: 7.5 (ref 5.0–8.0)

## 2015-11-03 NOTE — MAU Note (Signed)
G5P2 complaining of lower abdominal pain and depression. Reports being very stressed due to issues at home. Pt reports abdominal pain started last night and has continued this morning. Pt denies any bleeding or leaking of fluid. Pt reports feeling fetal movement.

## 2015-11-03 NOTE — MAU Provider Note (Signed)
History   [id number]   Chief Complaint  Patient presents with  . Abdominal Pain  . Stress  . Depression    HPI Stephanie [name] is a 30 y.o. female  [phone] at 5752w2d IUP here with report of increased depression.  Reports having marital problems with no further elaboration.  Denies current or past physical abuse with partner.  Denies suicidal ideation.  Reports decreased eating and sleeping.  Requests counseling services to assist with current situation.  Also reports more frequent Braxton Hick's - approximately 10-15/day.  Denies vaginal bleeding or constant abdominal pain.  Patient's last menstrual period was 05/06/2015.  OB History  Gravida Para Term Preterm AB Living  5 2 2   2 2   SAB TAB Ectopic Multiple Live Births      2        # Outcome Date GA Lbr Len/2nd Weight Sex Delivery Anes PTL Lv  5 Current           4 Term           3 Term           2 Ectopic           1 Ectopic               Past Medical History:  Diagnosis Date  . Anemia     Family History  Problem Relation Age of Onset  . Cancer Maternal Aunt   . Diabetes Maternal Grandmother   . Hypertension Maternal Grandmother   . Cancer Paternal Grandmother     Social History   Social History  . Marital status: Married    Spouse name: N/A  . Number of children: N/A  . Years of education: N/A   Social History Main Topics  . Smoking status: Never Smoker  . Smokeless tobacco: Never Used  . Alcohol use No  . Drug use: No  . Sexual activity: Yes    Birth control/ protection: None   Other Topics Concern  . None   Social History Narrative  . None    Allergies  Allergen Reactions  . Latex Itching    No current facility-administered medications on file prior to encounter.    Current Outpatient Prescriptions on File Prior to Encounter  Medication Sig Dispense Refill  . Prenatal Vit-Fe Fumarate-FA (PRENATAL COMPLETE) 14-0.4 MG TABS Take 1 tablet by mouth daily. 60 each 6  . dicyclomine  (BENTYL) 20 MG tablet Take 1 tablet (20 mg total) by mouth 2 (two) times daily. (Patient not taking: Reported on 07/06/2015) 20 tablet 0  . promethazine (PHENERGAN) 12.5 MG tablet Take 1 tablet (12.5 mg total) by mouth every 6 (six) hours as needed for nausea or vomiting. (Patient not taking: Reported on 07/06/2015) 30 tablet 0  . ranitidine (ZANTAC) 150 MG tablet Take 1 tablet (150 mg total) by mouth 2 (two) times daily. (Patient not taking: Reported on 07/06/2015) 60 tablet 0     Review of Systems  Constitutional: Positive for appetite change and fatigue. Negative for fever.  Genitourinary: Positive for pelvic pain. Negative for dysuria, vaginal bleeding and vaginal discharge.  Neurological: Negative for headaches.  Psychiatric/Behavioral: Positive for sleep disturbance. Negative for self-injury and suicidal ideas. The patient is not nervous/anxious.   All other systems reviewed and are negative.    Physical Exam   Vitals:   11/03/15 1211 11/03/15 1227  BP:  105/64  Pulse:  74  Temp:  98.5 F (36.9 C)  TempSrc:  Oral  Weight: 152 lb 1.3 oz (69 kg) 151 lb (68.5 kg)  Height: 5' (1.524 m) 5' (1.524 m)    Physical Exam  Constitutional: She is oriented to person, place, and time. She appears well-developed and well-nourished.  HENT:  Head: Normocephalic.  Neck: Normal range of motion. Neck supple.  Cardiovascular: Normal rate, regular rhythm and normal heart sounds.   Respiratory: Effort normal and breath sounds normal. No respiratory distress.  GI: Soft. There is no tenderness.  Genitourinary: No bleeding in the vagina. Vaginal discharge (mucusy) found.  Musculoskeletal: Normal range of motion. She exhibits no edema.  Neurological: She is alert and oriented to person, place, and time.  Skin: Skin is warm and dry.  Psychiatric: Judgment and thought content normal. Her mood appears not anxious. Her affect is not angry and not inappropriate. She is slowed and withdrawn. Cognition and  memory are normal. She exhibits a depressed mood.   Dilation: Closed Effacement (%): Thick Exam by:: Margarita Mail, CNM   MAU Course  Procedures  MDM Telepsych utilized > assessed patient, provided resources in community to assist with counseling  1425 Consulted with Dr Charlotta Newton > Reviewed HPI/counseling session with telepsych > discharge home with follow-up in office next week  Assessment and Plan  29 y.o. [id number] at [redacted]w[redacted]d IUP  Pelvic Pain in Pregnancy - Normal Exam Marital Issues  Plan: Discharge home Provided resources for counseling Schedule appointment in office  [name], CNM 11/03/2015 2:30 PM

## 2015-11-03 NOTE — BH Assessment (Signed)
Tele Assessment Note   Stephanie [name] is a 30 y.o. female who is [redacted] weeks pregnant presenting with depression for a little over a week. Pt shares that her husband has made several indications that he no longer wants to be with her and this is the cause for her depression. Pt denies several times any thoughts, intentions, or desires to harm herself, the unborn baby, or others. Pt expresses interest in seeking therapy to help her work thru her feelings. Pt shares that her mother is supportive and will listen to her, but she would prefer to speak to a non-biased person. OP therapy resources faxed to pt's RN, Neysa Bonito, for pt.   Diagnosis: Unspecified depressive d/o  Past Medical History:  Past Medical History:  Diagnosis Date  . Anemia     Past Surgical History:  Procedure Laterality Date  . NO PAST SURGERIES      Family History:  Family History  Problem Relation Age of Onset  . Cancer Maternal Aunt   . Diabetes Maternal Grandmother   . Hypertension Maternal Grandmother   . Cancer Paternal Grandmother     Social History:  reports that she has never smoked. She has never used smokeless tobacco. She reports that she does not drink alcohol or use drugs.  Additional Social History:  Alcohol / Drug Use Pain Medications: see PTA meds Prescriptions: see PTA meds Over the Counter: see PTA meds History of alcohol / drug use?: No history of alcohol / drug abuse  CIWA: CIWA-Ar BP: 105/64 Pulse Rate: 74 COWS:    PATIENT STRENGTHS: (choose at least two) Average or above average intelligence Capable of independent living Communication skills Motivation for treatment/growth  Allergies:  Allergies  Allergen Reactions  . Latex Itching    Home Medications:  Medications Prior to Admission  Medication Sig Dispense Refill  . acetaminophen (TYLENOL) 500 MG tablet Take 500 mg by mouth every 6 (six) hours as needed for moderate pain.    . Prenatal Vit-Fe Fumarate-FA (PRENATAL  COMPLETE) 14-0.4 MG TABS Take 1 tablet by mouth daily. 60 each 6  . dicyclomine (BENTYL) 20 MG tablet Take 1 tablet (20 mg total) by mouth 2 (two) times daily. (Patient not taking: Reported on 07/06/2015) 20 tablet 0  . promethazine (PHENERGAN) 12.5 MG tablet Take 1 tablet (12.5 mg total) by mouth every 6 (six) hours as needed for nausea or vomiting. (Patient not taking: Reported on 07/06/2015) 30 tablet 0  . ranitidine (ZANTAC) 150 MG tablet Take 1 tablet (150 mg total) by mouth 2 (two) times daily. (Patient not taking: Reported on 07/06/2015) 60 tablet 0    OB/GYN Status:  Patient's last menstrual period was 05/06/2015.  General Assessment Data Location of Assessment: [facility] TTS Assessment: In system Is this a Tele or Face-to-Face Assessment?: Tele Assessment Is this an Initial Assessment or a Re-assessment for this encounter?: Initial Assessment Marital status: Married Is patient pregnant?: Yes Pregnancy Status: Yes (Comment: include estimated delivery date) Living Arrangements: Spouse/significant other, Children Can pt return to current living arrangement?: Yes Admission Status: Voluntary Is patient capable of signing voluntary admission?: Yes Referral Source: Self/Family/Friend Insurance type: Medicaid     Crisis Care Plan Living Arrangements: Spouse/significant other, Children Name of Psychiatrist: none Name of Therapist: none  Education Status Is patient currently in school?: No  Risk to self with the past 6 months Suicidal Ideation: No Has patient been a risk to self within the past 6 months prior to admission? : No Suicidal Intent:  No Has patient had any suicidal intent within the past 6 months prior to admission? : No Is patient at risk for suicide?: No Suicidal Plan?: No Has patient had any suicidal plan within the past 6 months prior to admission? : No Access to Means: No What has been your use of drugs/alcohol within the last 12 months?: pt denies Previous  Attempts/Gestures: No Intentional Self Injurious Behavior: None Family Suicide History: Unknown Recent stressful life event(s): Conflict (Comment) (with husband) Persecutory voices/beliefs?: No Depression: Yes Depression Symptoms: Tearfulness, Isolating Substance abuse history and/or treatment for substance abuse?: No Suicide prevention information given to non-admitted patients: Not applicable  Risk to Others within the past 6 months Homicidal Ideation: No Does patient have any lifetime risk of violence toward others beyond the six months prior to admission? : No Thoughts of Harm to Others: No Current Homicidal Intent: No Current Homicidal Plan: No Access to Homicidal Means: No History of harm to others?: No Assessment of Violence: None Noted Does patient have access to weapons?: No Criminal Charges Pending?: No Does patient have a court date: No Is patient on probation?: No  Psychosis Hallucinations: None noted Delusions: None noted  Mental Status Report Appearance/Hygiene: Unremarkable Eye Contact: Good Motor Activity: Unremarkable Speech: Logical/coherent Level of Consciousness: Alert Mood: Pleasant Affect: Appropriate to circumstance Anxiety Level: None Thought Processes: Coherent, Relevant Judgement: Unimpaired Orientation: Person, Place, Time, Situation, Appropriate for developmental age Obsessive Compulsive Thoughts/Behaviors: None  Cognitive Functioning Concentration: Normal Memory: Recent Intact, Remote Intact IQ: Average Insight: Good Impulse Control: Good Appetite: Good Sleep: No Change Vegetative Symptoms: None  ADLScreening [facility] Assessment Services) Patient's cognitive ability adequate to safely complete daily activities?: Yes Patient able to express need for assistance with ADLs?: No Independently performs ADLs?: Yes (appropriate for developmental age)  Prior Inpatient Therapy Prior Inpatient Therapy: No  Prior Outpatient Therapy Prior  Outpatient Therapy: No Does patient have an ACCT team?: No Does patient have Intensive In-House Services?  : No Does patient have [facility] services? : No Does patient have P4CC services?: No  ADL Screening (condition at time of admission) Patient's cognitive ability adequate to safely complete daily activities?: Yes Is the patient deaf or have difficulty hearing?: No Does the patient have difficulty seeing, even when wearing glasses/contacts?: No Does the patient have difficulty concentrating, remembering, or making decisions?: No Patient able to express need for assistance with ADLs?: No Does the patient have difficulty dressing or bathing?: No Independently performs ADLs?: Yes (appropriate for developmental age) Does the patient have difficulty walking or climbing stairs?: No Weakness of Legs: None Weakness of Arms/Hands: None  Home Assistive Devices/Equipment Home Assistive Devices/Equipment: None  Therapy Consults (therapy consults require a physician order) PT Evaluation Needed: No OT Evalulation Needed: No SLP Evaluation Needed: No Abuse/Neglect Assessment (Assessment to be complete while patient is alone) Physical Abuse: Denies Verbal Abuse: Denies Sexual Abuse: Denies Exploitation of patient/patient's resources: Denies Self-Neglect: Denies Values / Beliefs Cultural Requests During Hospitalization: None Spiritual Requests During Hospitalization: None Consults Spiritual Care Consult Needed: No Social Work Consult Needed: No Merchant navy officer (For Healthcare) Does patient have an advance directive?: No Would patient like information on creating an advanced directive?: No - patient declined information Nutrition Screen- [facility] Adult/WL/AP Patient's home diet: Regular  Additional Information 1:1 In Past 12 Months?: No CIRT Risk: No Elopement Risk: No Does patient have medical clearance?: Yes     Disposition:  Disposition Initial Assessment Completed for this  Encounter: Yes (consulted with Dr. Daleen Bo) Disposition of Patient:  Referred to Patient referred to: Other (Comment) (referrals given for outpatient therapy)  Laddie AquasSamantha M Lonny Eisen 11/03/2015 2:06 PM

## 2015-12-08 ENCOUNTER — Inpatient Hospital Stay (HOSPITAL_COMMUNITY)
Admission: AD | Admit: 2015-12-08 | Discharge: 2015-12-08 | Disposition: A | Discharge status: Home / Self Care | Payer: Medicaid Other | Source: Ambulatory Visit | Attending: Obstetrics and Gynecology | Admitting: Obstetrics and Gynecology

## 2015-12-08 ENCOUNTER — Encounter (HOSPITAL_COMMUNITY): Payer: Self-pay | Admitting: Certified Nurse Midwife

## 2015-12-08 DIAGNOSIS — R079 Chest pain, unspecified: Secondary | ICD-10-CM

## 2015-12-08 DIAGNOSIS — O26892 Other specified pregnancy related conditions, second trimester: Secondary | ICD-10-CM | POA: Diagnosis not present

## 2015-12-08 DIAGNOSIS — Z3A27 27 weeks gestation of pregnancy: Secondary | ICD-10-CM | POA: Insufficient documentation

## 2015-12-08 DIAGNOSIS — R109 Unspecified abdominal pain: Secondary | ICD-10-CM | POA: Insufficient documentation

## 2015-12-08 DIAGNOSIS — O26893 Other specified pregnancy related conditions, third trimester: Secondary | ICD-10-CM

## 2015-12-08 LAB — URINALYSIS, ROUTINE W REFLEX MICROSCOPIC
Bilirubin Urine: NEGATIVE
Glucose, UA: NEGATIVE mg/dL
HGB URINE DIPSTICK: NEGATIVE
Ketones, ur: NEGATIVE mg/dL
Leukocytes, UA: NEGATIVE
Nitrite: NEGATIVE
Protein, ur: NEGATIVE mg/dL
SPECIFIC GRAVITY, URINE: 1.01 (ref 1.005–1.030)
pH: 7.5 (ref 5.0–8.0)

## 2015-12-08 NOTE — MAU Provider Note (Signed)
History    [id number] @ 27.2 wks in with sharp shooting abd pain that started while she was riding in the care so she was brought in for eval. Pt states when  Baby kick she has pain. Also c/o chest pain. Pt has hx of anxiety and problems with relationship with the [facility]. CSN: [id number]  Arrival date & time 12/08/15  1319   None     Chief Complaint  Patient presents with  . Abdominal Pain  . Chest Pain    HPI  Past Medical History:  Diagnosis Date  . Anemia     Past Surgical History:  Procedure Laterality Date  . NO PAST SURGERIES      Family History  Problem Relation Age of Onset  . Cancer Maternal Aunt   . Diabetes Maternal Grandmother   . Hypertension Maternal Grandmother   . Cancer Paternal Grandmother     Social History  Substance Use Topics  . Smoking status: Never Smoker  . Smokeless tobacco: Never Used  . Alcohol use No    OB History    Gravida Para Term Preterm AB Living   5 2 2   2 2    SAB TAB Ectopic Multiple Live Births       2   2      Review of Systems  Constitutional: Negative.   HENT: Negative.   Eyes: Negative.   Respiratory: Negative.   Cardiovascular: Positive for chest pain.  Gastrointestinal: Positive for abdominal pain.  Endocrine: Negative.   Genitourinary: Negative.   Musculoskeletal: Negative.   Skin: Negative.   Allergic/Immunologic: Negative.   Neurological: Negative.   Hematological: Negative.   Psychiatric/Behavioral: Negative.     Allergies  Latex  Home Medications    BP 112/64 (BP Location: Left Arm)   Pulse 84   Temp 97.8 F (36.6 C) (Oral)   Resp 18   LMP 05/06/2015   Physical Exam  Constitutional: She is oriented to person, place, and time. She appears well-developed and well-nourished.  HENT:  Head: Normocephalic.  Eyes: Pupils are equal, round, and reactive to light.  Neck: Normal range of motion.  Cardiovascular: Normal rate, regular rhythm, normal heart sounds and intact distal pulses.    Pulmonary/Chest: Effort normal and breath sounds normal.  Abdominal: Soft. Bowel sounds are normal.  Genitourinary: Vagina normal and uterus normal.  Musculoskeletal: Normal range of motion.  Neurological: She is alert and oriented to person, place, and time. She has normal reflexes.  Skin: Skin is warm and dry.  Psychiatric: She has a normal mood and affect. Her behavior is normal. Judgment and thought content normal.    MAU Course  Procedures (including critical care time)  Labs Reviewed  URINALYSIS, ROUTINE W REFLEX MICROSCOPIC (NOT [facility])   No results found.   No diagnosis found.    MDM  abd pain in preg Chest pain SVE cl/th/post/high. FHR pattern reassuring, no uterine contractions. EKG normal. POC discussed with Dr. Charlotta Newtonzan and pt to be d/c home. Lengthy discussion on home measures for pain management from fetus kicking.

## 2015-12-08 NOTE — MAU Note (Signed)
Pt states she has severe sharp shooting pains that just started in the car. She also reports chest pain. Pt states + FM and denies vaginal bleeding or LOF.

## 2016-02-12 LAB — OB RESULTS CONSOLE GBS: STREP GROUP B AG: POSITIVE

## 2016-03-02 ENCOUNTER — Inpatient Hospital Stay (HOSPITAL_COMMUNITY)
Admission: AD | Admit: 2016-03-02 | Discharge: 2016-03-02 | DRG: 781 | Disposition: A | Discharge status: Home / Self Care | Payer: Medicaid Other | Source: Ambulatory Visit | Attending: Obstetrics and Gynecology | Admitting: Obstetrics and Gynecology

## 2016-03-02 ENCOUNTER — Encounter (HOSPITAL_COMMUNITY): Payer: Self-pay | Admitting: *Deleted

## 2016-03-02 ENCOUNTER — Inpatient Hospital Stay (HOSPITAL_COMMUNITY): Payer: Medicaid Other

## 2016-03-02 DIAGNOSIS — O99013 Anemia complicating pregnancy, third trimester: Secondary | ICD-10-CM | POA: Diagnosis present

## 2016-03-02 DIAGNOSIS — O99820 Streptococcus B carrier state complicating pregnancy: Secondary | ICD-10-CM | POA: Diagnosis present

## 2016-03-02 DIAGNOSIS — O99019 Anemia complicating pregnancy, unspecified trimester: Secondary | ICD-10-CM | POA: Diagnosis present

## 2016-03-02 DIAGNOSIS — O368930 Maternal care for other specified fetal problems, third trimester, not applicable or unspecified: Secondary | ICD-10-CM | POA: Diagnosis present

## 2016-03-02 DIAGNOSIS — O368390 Maternal care for abnormalities of the fetal heart rate or rhythm, unspecified trimester, not applicable or unspecified: Secondary | ICD-10-CM | POA: Diagnosis present

## 2016-03-02 DIAGNOSIS — Z3A39 39 weeks gestation of pregnancy: Secondary | ICD-10-CM

## 2016-03-02 LAB — URINALYSIS, ROUTINE W REFLEX MICROSCOPIC
BILIRUBIN URINE: NEGATIVE
Glucose, UA: NEGATIVE mg/dL
Ketones, ur: NEGATIVE mg/dL
Nitrite: NEGATIVE
Protein, ur: NEGATIVE mg/dL
SPECIFIC GRAVITY, URINE: 1.008 (ref 1.005–1.030)
pH: 7 (ref 5.0–8.0)

## 2016-03-02 LAB — TYPE AND SCREEN
ABO/RH(D): B POS
Antibody Screen: NEGATIVE

## 2016-03-02 LAB — CBC
HCT: 32.7 % — ABNORMAL LOW (ref 36.0–46.0)
Hemoglobin: 11.2 g/dL — ABNORMAL LOW (ref 12.0–15.0)
MCH: 28.8 pg (ref 26.0–34.0)
MCHC: 34.3 g/dL (ref 30.0–36.0)
MCV: 84.1 fL (ref 78.0–100.0)
Platelets: 182 10*3/uL (ref 150–400)
RBC: 3.89 MIL/uL (ref 3.87–5.11)
RDW: 13.8 % (ref 11.5–15.5)
WBC: 6.8 10*3/uL (ref 4.0–10.5)

## 2016-03-02 LAB — POCT FERN TEST

## 2016-03-02 MED ORDER — OXYTOCIN 40 UNITS IN LACTATED RINGERS INFUSION - SIMPLE MED: 2.5000 [IU]/h | INTRAVENOUS | Status: DC

## 2016-03-02 MED ORDER — LACTATED RINGERS IV SOLN
INTRAVENOUS | Status: DC
  Administered 2016-03-02: 18:00:00 via INTRAVENOUS

## 2016-03-02 MED ORDER — FLEET ENEMA 7-19 GM/118ML RE ENEM: 1.0000 | ENEMA | RECTAL | Status: DC | PRN

## 2016-03-02 MED ORDER — LACTATED RINGERS IV SOLN: 500.0000 mL | INTRAVENOUS | Status: DC | PRN

## 2016-03-02 MED ORDER — DEXTROSE 5 % IN LACTATED RINGERS IV BOLUS
1000.0000 mL | Freq: Once | INTRAVENOUS | Status: AC
  Administered 2016-03-02: 1000 mL via INTRAVENOUS

## 2016-03-02 MED ORDER — ACETAMINOPHEN 325 MG PO TABS: 650.0000 mg | ORAL_TABLET | ORAL | Status: DC | PRN

## 2016-03-02 MED ORDER — ONDANSETRON HCL 4 MG/2ML IJ SOLN: 4.0000 mg | Freq: Four times a day (QID) | INTRAMUSCULAR | Status: DC | PRN

## 2016-03-02 MED ORDER — LIDOCAINE HCL (PF) 1 % IJ SOLN: 30.0000 mL | INTRAMUSCULAR | Status: DC | PRN

## 2016-03-02 MED ORDER — LACTATED RINGERS IV SOLN
INTRAVENOUS | Status: DC
  Administered 2016-03-02: 11:00:00 via INTRAVENOUS

## 2016-03-02 MED ORDER — SOD CITRATE-CITRIC ACID 500-334 MG/5ML PO SOLN: 30.0000 mL | ORAL | Status: DC | PRN

## 2016-03-02 MED ORDER — OXYTOCIN BOLUS FROM INFUSION: 500.0000 mL | Freq: Once | INTRAVENOUS | Status: DC

## 2016-03-02 NOTE — Discharge Summary (Signed)
Physician Discharge Summary  Patient ID: Stephanie ShoresDarlene A Talbot MRN: [id number] DOB/AGE: 31/05/1985 31 y.o.   *Of Note:  BPP returns 8/8.  Dr. N.Dillard consulted and advised give patient of overnight observation with MFM consult in AM or discharge with outpatient follow up.  In room to discuss.  Patient reports "agitation" and expresses anger with lack of care prior to this providers arrival.  Patient further expresses frustration about being told she was "being scheduled for induction," but inability to be induced tonight.  Reassurances given.  Redirected and encouraged to focus on options as of current.  Patient states that if she is not going to be induced, it is no point in staying in the hospital.  Given option to follow up with MFM in am and declines.  Requests discharge.   Admit date: 03/02/2016 Discharge date: 03/02/2016  Admission Diagnoses: Non-Reassuring FHT  Discharge Diagnoses:  Active Problems:   Non-reassuring electronic fetal monitoring tracing   Discharged Condition: stable  Hospital Course: IV hydration, BPP  Consults: None  Significant Diagnostic Studies: labs: CBC, UA  Treatments: IV hydration  Discharge Exam: Blood pressure 115/84, pulse 72, temperature 98.5 F (36.9 C), temperature source Axillary, resp. rate 18, height 5' (1.524 m), weight 83 kg (183 lb), last menstrual period 05/06/2015. General appearance: alert, cooperative and no distress Chest wall: no tenderness Cardio: regular rate and rhythm GI: normal findings: soft, non-tender Extremities: extremities normal, atraumatic, no cyanosis or edema Pulses: 2+ and symmetric Skin: Skin color, texture, turgor normal. No rashes or lesions  Disposition: 01-Home or Self Care   Allergies as of 03/02/2016      Reactions   Latex Itching      Medication List    TAKE these medications   acetaminophen 500 MG tablet Commonly known as:  TYLENOL Take 500-1,000 mg by mouth every 6 (six) hours as needed for  mild pain, moderate pain or headache.   VITAFOL GUMMIES 3.33-0.333-34.8 MG Chew Chew 3 each by mouth daily.       *Follow up in office on Tuesday, Jan 2 for ROB and NST   Signed: Cherre RobinsJessica L Saeed Toren MSN, CNM 03/02/2016, 9:15 PM

## 2016-03-02 NOTE — Progress Notes (Signed)
Provider at bedside discussing options for pt care. Provider made pt aware that she can be saw by MFM in AM and monitored overnight, or discharged to go home and come back to re-evaluated by MFM on the following day.   Pt made decision to be discharged and go home and be re-evaluated either tomorrow morning 03/03/2016 or at pre-scheduled prenatal visit on 03/04/2016.

## 2016-03-02 NOTE — Progress Notes (Signed)
Stephanie [name] is a 30 y.o. [id number] at 2039w3dadmitted for non reasurring fht's  Subjective:  Comfortable with  Labor support without medications Contractions every irregular mild Objective: BP 118/79   Pulse 79   Temp 98.1 F (36.7 C)   Resp 18   Ht 5' (1.524 m)   Wt 183 lb (83 kg)   LMP 05/06/2015   BMI 35.74 kg/m  No intake/output data recorded. No intake/output data recorded.  FHT: Category 2 decreased variaibility with mild variables  1/th/high Labs: Lab Results  Component Value Date   WBC 6.8 03/02/2016   HGB 11.2 (L) 03/02/2016   HCT 32.7 (L) 03/02/2016   MCV 84.1 03/02/2016   PLT 182 03/02/2016    Assessment / Plan: Repeat BPP amd Amnisure Fetal Wellbeing: nonreassuring Discussed with patient that the only reason that she would be induced is if her BPP testing was not satisfactory. I am going to repeat BPP and amnisure this evening. Strip continues to have decreased variability and mild variaibles. She is now saying that she had bleeding this morning and decreased fetal movement. This was not reported in her original assessment by the patient.  Taysen Bushart A Jakeel Starliper CNM 03/02/2016, 6:13 PM

## 2016-03-02 NOTE — MAU Note (Signed)
?   SROM @ 0615 this AM;

## 2016-03-02 NOTE — Progress Notes (Signed)
Discharge Instructions reviewed with Pt. Signed and Copy noted in Shadow Chart. IV removed. Site Clean, dry and intact. Strip reviewed by both CNM and Physician prior to discharge.  Fetal Monitors removed in preparation for Discharge. Pt ambulatory, denies offer of wheelchair. Pt  and Significant Other escorted by RN  to main entrance of [facility]. Pt helped into car by significant other.

## 2016-03-02 NOTE — H&P (Signed)
Stephanie ShoresDarlene A [name] is a 30 y.o. female presenting for contractions and possible leaking of fluid..  Pregnancy followed at [facility] since 7 weeks 6 days  weeks and remarkable for: Anemia and Depression/Anxiety, GBS +    OB History    Gravida Para Term Preterm AB Living   5 2 2   2 2    SAB TAB Ectopic Multiple Live Births       2   2     Past Medical History:  Diagnosis Date  . Anemia    Past Surgical History:  Procedure Laterality Date  . NO PAST SURGERIES      Family History:   family history includes Cancer in her maternal aunt and paternal grandmother; Diabetes in her maternal grandmother; Hypertension in her maternal grandmother. Social History:    reports that she has never smoked. She has never used smokeless tobacco. She reports that she does not drink alcohol or use drugs.   Prenatal labs: ABO, Rh: --/--/B POS (12/31 1229) Antibody: NEG (12/31 1229) Rubella: !Error! RPR: Nonreactive (05/26 0000)  HBsAg: Negative (05/26 0000)  HIV: Non-reactive (05/26 0000)  GBS: Positive (12/12 0000)    Prenatal Transfer Tool  Maternal Diabetes: No Genetic Screening: Normal Maternal Ultrasounds/Referrals: Normal Fetal Ultrasounds or other Referrals:  None Maternal Substance Abuse:  No Significant Maternal Medications:  None Significant Maternal Lab Results: GBS positive   Dilation: Closed (outter os is FT but inner oc is closed) Station: -3 Exam by:: Stephanie Oldee Carter RN Blood pressure 118/79, pulse 79, temperature 98.1 F (36.7 C), resp. rate 18, height 5' (1.524 m), weight 183 lb (83 kg), last menstrual period 05/06/2015.  General Appearance: Alert, appropriate appearance for age. No acute distress HEENT Exam: Grossly normal Chest/Respiratory Exam: Normal chest wall and respirations. Clear to auscultation Cardiovascular Exam: Regular rate and rhythm. S1, S2, no murmur Gastrointestinal Exam: soft, non-tender, Uterus gravid with size compatible with GA, Vertex presentation by  Leopold's maneuvers Psychiatric Exam: Alert and oriented, appropriate affect  ++++++++++++++++++++++++++++++++++++++++++++++++++++++++++++++++  Vaginal exam: 1/th/high  Fetal tracings: Category 2 Bpp 8/8  ++++++++++++++++++++++++++++++++++++++++++++++++++++++++++++++++   Assessment/Plan: Non reassuring FHT's Admit for observation  Continuous fetal monitoring  NPO    Stephanie [name] CNM 03/02/2016, 6:05 PM

## 2016-03-02 NOTE — Anesthesia Pain Management Evaluation Note (Signed)
  CRNA Pain Management Visit Note  Patient: Stephanie [name], 30 y.o., female  "Hello I am a [facility] the anesthesia [facility]. We have an anesthesia team available at all times to provide care throughout the hospital, including epidural management and anesthesia for C-section. I don't know your plan for the delivery whether it a natural birth, water birth, IV sedation, nitrous supplementation, doula or epidural, but we want to meet your pain goals."   1.Was your pain managed to your expectations on prior hospitalizations?   Yes   2.What is your expectation for pain management during this hospitalization?     Epidural  3.How can we help you reach that goal? Support PRN  Record the patient's initial score and the patient's pain goal.   Pain: 3  Pain Goal: 6 [facility] wants you to be able to say your pain was always managed very well.  [facility] CenterWRINKLE,Jenisa Monty 03/02/2016

## 2016-03-03 ENCOUNTER — Inpatient Hospital Stay (HOSPITAL_COMMUNITY): Payer: Medicaid Other | Admitting: Anesthesiology

## 2016-03-03 ENCOUNTER — Inpatient Hospital Stay (HOSPITAL_COMMUNITY): Payer: Medicaid Other

## 2016-03-03 ENCOUNTER — Encounter (HOSPITAL_COMMUNITY): Payer: Self-pay | Admitting: Anesthesiology

## 2016-03-03 ENCOUNTER — Inpatient Hospital Stay (HOSPITAL_COMMUNITY)
Admission: AD | Admit: 2016-03-03 | Discharge: 2016-03-05 | DRG: 775 | Disposition: A | Discharge status: Home / Self Care | Payer: Medicaid Other | Source: Ambulatory Visit | Attending: Obstetrics and Gynecology | Admitting: Obstetrics and Gynecology

## 2016-03-03 DIAGNOSIS — Z3A39 39 weeks gestation of pregnancy: Secondary | ICD-10-CM

## 2016-03-03 DIAGNOSIS — Z8249 Family history of ischemic heart disease and other diseases of the circulatory system: Secondary | ICD-10-CM

## 2016-03-03 DIAGNOSIS — O99824 Streptococcus B carrier state complicating childbirth: Secondary | ICD-10-CM | POA: Diagnosis present

## 2016-03-03 DIAGNOSIS — O9902 Anemia complicating childbirth: Secondary | ICD-10-CM | POA: Diagnosis present

## 2016-03-03 DIAGNOSIS — Z6836 Body mass index (BMI) 36.0-36.9, adult: Secondary | ICD-10-CM

## 2016-03-03 DIAGNOSIS — Z833 Family history of diabetes mellitus: Secondary | ICD-10-CM | POA: Diagnosis not present

## 2016-03-03 DIAGNOSIS — O99214 Obesity complicating childbirth: Secondary | ICD-10-CM | POA: Diagnosis present

## 2016-03-03 DIAGNOSIS — F418 Other specified anxiety disorders: Secondary | ICD-10-CM | POA: Diagnosis present

## 2016-03-03 DIAGNOSIS — O99344 Other mental disorders complicating childbirth: Secondary | ICD-10-CM | POA: Diagnosis present

## 2016-03-03 DIAGNOSIS — E669 Obesity, unspecified: Secondary | ICD-10-CM | POA: Diagnosis present

## 2016-03-03 DIAGNOSIS — B951 Streptococcus, group B, as the cause of diseases classified elsewhere: Secondary | ICD-10-CM | POA: Diagnosis present

## 2016-03-03 DIAGNOSIS — Z3490 Encounter for supervision of normal pregnancy, unspecified, unspecified trimester: Secondary | ICD-10-CM

## 2016-03-03 DIAGNOSIS — D649 Anemia, unspecified: Secondary | ICD-10-CM | POA: Diagnosis present

## 2016-03-03 LAB — URINALYSIS, ROUTINE W REFLEX MICROSCOPIC
BILIRUBIN URINE: NEGATIVE
Glucose, UA: NEGATIVE mg/dL
HGB URINE DIPSTICK: NEGATIVE
KETONES UR: NEGATIVE mg/dL
Leukocytes, UA: NEGATIVE
Nitrite: NEGATIVE
Protein, ur: NEGATIVE mg/dL
SPECIFIC GRAVITY, URINE: 1.013 (ref 1.005–1.030)
pH: 7 (ref 5.0–8.0)

## 2016-03-03 LAB — CBC
HEMATOCRIT: 32.2 % — AB (ref 36.0–46.0)
HEMOGLOBIN: 10.7 g/dL — AB (ref 12.0–15.0)
MCH: 28.2 pg (ref 26.0–34.0)
MCHC: 33.2 g/dL (ref 30.0–36.0)
MCV: 84.7 fL (ref 78.0–100.0)
Platelets: 189 10*3/uL (ref 150–400)
RBC: 3.8 MIL/uL — ABNORMAL LOW (ref 3.87–5.11)
RDW: 13.9 % (ref 11.5–15.5)
WBC: 6.7 10*3/uL (ref 4.0–10.5)

## 2016-03-03 LAB — TYPE AND SCREEN
ABO/RH(D): B POS
ANTIBODY SCREEN: NEGATIVE

## 2016-03-03 LAB — RPR: RPR Ser Ql: NONREACTIVE

## 2016-03-03 MED ORDER — LACTATED RINGERS IV SOLN
500.0000 mL | Freq: Once | INTRAVENOUS | Status: AC
  Administered 2016-03-03: 500 mL via INTRAVENOUS

## 2016-03-03 MED ORDER — EPHEDRINE 5 MG/ML INJ
10.0000 mg | INTRAVENOUS | Status: DC | PRN
  Filled 2016-03-03: qty 4

## 2016-03-03 MED ORDER — FLEET ENEMA 7-19 GM/118ML RE ENEM: 1.0000 | ENEMA | RECTAL | Status: DC | PRN

## 2016-03-03 MED ORDER — OXYCODONE-ACETAMINOPHEN 5-325 MG PO TABS
2.0000 | ORAL_TABLET | ORAL | Status: DC | PRN
Start: 2016-03-03 — End: 2016-03-04

## 2016-03-03 MED ORDER — PHENYLEPHRINE 40 MCG/ML (10ML) SYRINGE FOR IV PUSH (FOR BLOOD PRESSURE SUPPORT)
PREFILLED_SYRINGE | INTRAVENOUS | Status: AC
  Filled 2016-03-03: qty 20

## 2016-03-03 MED ORDER — FENTANYL 2.5 MCG/ML BUPIVACAINE 1/10 % EPIDURAL INFUSION (WH - ANES)
INTRAMUSCULAR | Status: AC
  Filled 2016-03-03: qty 100

## 2016-03-03 MED ORDER — OXYCODONE-ACETAMINOPHEN 5-325 MG PO TABS: 1.0000 | ORAL_TABLET | ORAL | Status: DC | PRN

## 2016-03-03 MED ORDER — PENICILLIN G POT IN DEXTROSE 60000 UNIT/ML IV SOLN
3.0000 10*6.[IU] | INTRAVENOUS | Status: DC
  Filled 2016-03-03 (×4): qty 50

## 2016-03-03 MED ORDER — ONDANSETRON HCL 4 MG/2ML IJ SOLN
4.0000 mg | Freq: Four times a day (QID) | INTRAMUSCULAR | Status: DC | PRN
  Filled 2016-03-03: qty 2

## 2016-03-03 MED ORDER — LIDOCAINE HCL (PF) 1 % IJ SOLN
30.0000 mL | INTRAMUSCULAR | Status: DC | PRN
  Filled 2016-03-03: qty 30

## 2016-03-03 MED ORDER — LIDOCAINE HCL (PF) 1 % IJ SOLN
INTRAMUSCULAR | Status: DC | PRN
  Administered 2016-03-03: 3 mL via EPIDURAL
  Administered 2016-03-03: 4 mL via EPIDURAL

## 2016-03-03 MED ORDER — PHENYLEPHRINE 40 MCG/ML (10ML) SYRINGE FOR IV PUSH (FOR BLOOD PRESSURE SUPPORT)
80.0000 ug | PREFILLED_SYRINGE | INTRAVENOUS | Status: DC | PRN
  Filled 2016-03-03: qty 5

## 2016-03-03 MED ORDER — LACTATED RINGERS IV SOLN
500.0000 mL | INTRAVENOUS | Status: DC | PRN
  Administered 2016-03-03: 500 mL via INTRAVENOUS

## 2016-03-03 MED ORDER — OXYTOCIN 40 UNITS IN LACTATED RINGERS INFUSION - SIMPLE MED
2.5000 [IU]/h | INTRAVENOUS | Status: DC
  Administered 2016-03-04: 2.5 [IU]/h via INTRAVENOUS
  Filled 2016-03-03: qty 1000

## 2016-03-03 MED ORDER — PENICILLIN G POTASSIUM 5000000 UNITS IJ SOLR
5.0000 10*6.[IU] | Freq: Once | INTRAVENOUS | Status: AC
  Administered 2016-03-03: 5 10*6.[IU] via INTRAVENOUS
  Filled 2016-03-03: qty 5

## 2016-03-03 MED ORDER — FENTANYL 2.5 MCG/ML BUPIVACAINE 1/10 % EPIDURAL INFUSION (WH - ANES)
14.0000 mL/h | INTRAMUSCULAR | Status: DC | PRN
  Administered 2016-03-03: 12 mL/h via EPIDURAL

## 2016-03-03 MED ORDER — DIPHENHYDRAMINE HCL 50 MG/ML IJ SOLN: 12.5000 mg | INTRAMUSCULAR | Status: DC | PRN

## 2016-03-03 MED ORDER — LACTATED RINGERS IV SOLN
INTRAVENOUS | Status: DC
  Administered 2016-03-03: 23:00:00 via INTRAVENOUS

## 2016-03-03 MED ORDER — SOD CITRATE-CITRIC ACID 500-334 MG/5ML PO SOLN: 30.0000 mL | ORAL | Status: DC | PRN

## 2016-03-03 MED ORDER — ACETAMINOPHEN 325 MG PO TABS: 650.0000 mg | ORAL_TABLET | ORAL | Status: DC | PRN

## 2016-03-03 MED ORDER — OXYTOCIN BOLUS FROM INFUSION
500.0000 mL | Freq: Once | INTRAVENOUS | Status: AC
  Administered 2016-03-04: 500 mL via INTRAVENOUS

## 2016-03-03 NOTE — L&D Delivery Note (Signed)
Delivery Note 0108: Nurse call reports patient C/C/+3.  En route and staff calls and reports infant crowning.  In room and nurse at bedside with infant head at +5.  Provider donned gloves and patient delivered as below with staff and family support.   At 1:12 AM, on Mar 04, 2016, a viable female "Journey" was delivered via Vaginal, Spontaneous Delivery (Presentation:Left Occiput Posterior with manual restitution to LOT). Shoulders delivered easily and infant with good tone and spontaneous cry. Tactile stimulation given by provider after infant placed on mother's abdomen. Infant APGAR: 9,9. Cord clamped, cut, and blood collected. Placenta delivered spontaneously and noted to be intact with 3VC upon inspection.  Vaginal inspection revealed a left labial laceration that was hemostatic and therefore unrepaired.  Fundus firm, below the umbilicus, and bleeding small.  Mother hemodynamically stable and infant skin to skin prior to provider exit.  Mother unsure of birth control method and opts to breastfeed.  Infant weight at one hour of life: 7lbs 1.2oz, 20in   Anesthesia:  Epidural Episiotomy:  None Lacerations: Labial (Left Side) Suture Repair: None Est. Blood Loss (mL): 100  Mom to postpartum.  Baby to Couplet care / Skin to Skin.  Cherre RobinsJessica L Maraya Gwilliam MSN, CNM 03/04/2016, 1:31 AM

## 2016-03-03 NOTE — MAU Note (Signed)
Pt states she was sent over by Dr. Normand Sloopillard to look at the baby's strip.

## 2016-03-03 NOTE — Progress Notes (Signed)
1 Unsuccessful Attempt at a foley Bulb insertion by Provider.

## 2016-03-03 NOTE — Progress Notes (Signed)
Stephanie [name] A [name] MRN: [id number]  Subjective: -Patient comfortable s/p epidural.   Objective: BP 121/86   Pulse 77   Temp 98.2 F (36.8 C) (Axillary)   Resp 18   Ht 5' (1.524 m)   Wt 83.6 kg (184 lb 3.2 oz)   LMP 05/06/2015   SpO2 97%   BMI 35.97 kg/m  No intake/output data recorded. No intake/output data recorded.  Fetal Monitoring: FHT: 145 bpm, Min Var, -Decels, -Accels UC: Q2-423min, palpates strong    Vaginal Exam: SVE:   Dilation: 6 Effacement (%): 90 Station: -1 Exam by:: Stephanie [name], CNm Membranes:AROM of moderate amt clear fluid Internal Monitors: FSE applied Foley in place, but urine blood tinged.  Inspection reveals blood at urethra outlet and balloon palpated during vaginal exam  Augmentation/Induction: Pitocin:None Cytotec: None  Assessment:  IUP at 39.4wks Cat I FT  Amniotomy Active Labor  Plan: -Discussed r/b of fetal scalp electrode including infection, trauma to fetal scalp, and ability to monitor fetal heart rate more accurately.  Patient questions addressed and ultimately agrees to placement.  -Attempt to move catheter into bladder further or replace if necessary -Continue other mgmt as ordered   Stephanie CavaJessica L Cashmere Harmes,MSN, CNM 03/03/2016, 11:27 PM

## 2016-03-03 NOTE — Progress Notes (Signed)
Patient ID: Stephanie [name], female   DOB: 06/18/1985, 31 y.o.   MRN: [id number]   Pt has had a non reactive tracing for the last two days.  Her BPP was 8/8 yesterday and today it is pending.  The fetal tracing is reassuring enough not to do a imminent delivery but not reassuring enough to recommend the pt go home.  She was offered induction and has decided to stay.  She understnds the increase risk of cesarean section.  Will proceed with induction

## 2016-03-03 NOTE — H&P (Signed)
Marlowe ShoresDarlene A Botto is a 31 y.O.[id number].G5P2022 @ 426w4d  female presenting for medical induction of labor due to non reassuring strip. Bpp 8/10.  Pregnancy followed at [facility] since 7139w6d  weeks and remarkable for: Anemia, maternal depression and anxiety, GBS positive    OB History    Gravida Para Term Preterm AB Living   5 2 2   2 2    SAB TAB Ectopic Multiple Live Births       2   2    Ectopic pregnancies x 2 Past Medical History:  Diagnosis Date  . Anemia    Past Surgical History:  Procedure Laterality Date  . NO PAST SURGERIES      Family History:   family history includes Cancer in her maternal aunt and paternal grandmother; Diabetes in her maternal grandmother; Hypertension in her maternal grandmother. Social History:    reports that she has never smoked. She has never used smokeless tobacco. She reports that she does not drink alcohol or use drugs.   Prenatal labs: ABO, Rh: --/--/B POS (12/31 1229) Antibody: NEG (12/31 1229) Rubella: !Error!Immune RPR: Non Reactive (12/31 1229)  HBsAg: Negative (05/26 0000)  HIV: Non-reactive (05/26 0000)  GBS: Positive (12/12 0000)    Prenatal Transfer Tool  Maternal Diabetes: No Genetic Screening: Normal Maternal Ultrasounds/Referrals: Normal Fetal Ultrasounds or other Referrals:  Other: BPP 8/10 Maternal Substance Abuse:  No Significant Maternal Medications:  None Significant Maternal Lab Results: Lab values include: Group B Strep positive     Blood pressure 121/79, pulse 78, temperature 97.3 F (36.3 C), temperature source Oral, resp. rate 18, height 5' (1.524 m), weight 184 lb 3.2 oz (83.6 kg), last menstrual period 05/06/2015, SpO2 99 %.  General Appearance: Alert, appropriate appearance for age. No acute distress HEENT Exam: Grossly normal Chest/Respiratory Exam: Normal chest wall and respirations. Clear to auscultation Cardiovascular Exam: Regular rate and rhythm. S1, S2, no murmur Gastrointestinal Exam: soft, non-tender,  Uterus gravid with size compatible with GA, Vertex presentation by Leopold's maneuvers Psychiatric Exam: Alert and oriented, appropriate affect  ++++++++++++++++++++++++++++++++++++++++++++++++++++++++++++++++  Vaginal exam: 1-2/50/-2  Fetal tracings: Category 2 - Dr Normand Sloopillard aware  ++++++++++++++++++++++++++++++++++++++++++++++++++++++++++++++++   Assessment/Plan: Non reassuring FHT's Admit for induction of labor Foley Bulb induction- Attempted Foley Bulb placement-unsuccessful. Will have Gerrit HeckJessica Emly CNM place  Plans Iv pain medicine/Epidural Declines C/S per Dr Normand Sloopillard Anticipate Vaginal Delivery    Illene BolusLori Clemmons CNM 03/03/2016, 8:58 PM

## 2016-03-03 NOTE — Progress Notes (Signed)
Stephanie ShoresDarlene A Gorton MRN: [id number]  Subjective: -Care assumed of 31 y.o. [id number] at 4585w4d who presents for IOL secondary to NRFHT.  In room to discuss POC and attempt foley bulb placement.  Patient reports extreme and increasing discomfort with contractions.  Patient currently using nitrous oxide and reports no relief.  Objective: BP 132/88   Pulse 72   Temp 98.2 F (36.8 C) (Axillary)   Resp 18   Ht 5' (1.524 m)   Wt 83.6 kg (184 lb 3.2 oz)   LMP 05/06/2015   SpO2 99%   BMI 35.97 kg/m  No intake/output data recorded. No intake/output data recorded.  Fetal Monitoring: FHT: 155 bpm, Min Var, +Variable Decels, -Accels UC: palpates moderate to strong    Vaginal Exam: SVE:   Dilation: 3.5 Effacement (%): 70 Station: -1 Exam by:: Layal Javid, CNm Membranes:Intact Internal Monitors: None  Augmentation/Induction: Pitocin:None Cytotec: None  Assessment:  IUP at 39.4wks Cat II FT  IOL Bishop Score: 8  Plan: -Okay for epidural   -Will allow for comfort and then will return to perform AROM and insert FSE for improved fetal tracing  -Continue other mgmt as ordered -Dr. ND updated on patient status and agrees with POC  Stephanie CopaJessica L Semaya Vida,MSN, CNM 03/03/2016, 10:09 PM

## 2016-03-03 NOTE — Anesthesia Procedure Notes (Signed)
Epidural Patient location during procedure: OB Start time: 03/03/2016 10:32 PM  Staffing Anesthesiologist: Mal AmabileFOSTER, Sanam Marmo Performed: anesthesiologist   Preanesthetic Checklist Completed: patient identified, site marked, surgical consent, pre-op evaluation, timeout performed, IV checked, risks and benefits discussed and monitors and equipment checked  Epidural Patient position: sitting Prep: site prepped and draped and DuraPrep Patient monitoring: continuous pulse ox and blood pressure Approach: midline Location: L3-L4 Injection technique: LOR air  Needle:  Needle type: Tuohy  Needle gauge: 17 G Needle length: 9 cm and 9 Needle insertion depth: 5 cm cm Catheter type: closed end flexible Catheter size: 19 Gauge Catheter at skin depth: 10 cm Test dose: negative and Other  Assessment Events: blood not aspirated, injection not painful, no injection resistance, negative IV test and no paresthesia  Additional Notes Patient identified. Risks and benefits discussed including failed block, incomplete  Pain control, post dural puncture headache, nerve damage, paralysis, blood pressure Changes, nausea, vomiting, reactions to medications-both toxic and allergic and post Partum back pain. All questions were answered. Patient expressed understanding and wished to proceed. Sterile technique was used throughout procedure. Epidural site was Dressed with sterile barrier dressing. No paresthesias, signs of intravascular injection Or signs of intrathecal spread were encountered.  Patient was more comfortable after the epidural was dosed. Please see RN's note for documentation of vital signs and FHR which are stable.

## 2016-03-03 NOTE — Anesthesia Preprocedure Evaluation (Signed)
Anesthesia Evaluation  Patient identified by MRN, date of birth, ID band Patient awake    Reviewed: Allergy & Precautions, Patient's Chart, lab work & pertinent test results  Airway Mallampati: III       Dental no notable dental hx. (+) Teeth Intact   Pulmonary neg pulmonary ROS,    Pulmonary exam normal breath sounds clear to auscultation       Cardiovascular negative cardio ROS Normal cardiovascular exam Rhythm:Regular Rate:Normal     Neuro/Psych negative neurological ROS  negative psych ROS   GI/Hepatic negative GI ROS, Neg liver ROS,   Endo/Other  Obesity  Renal/GU negative Renal ROS  negative genitourinary   Musculoskeletal negative musculoskeletal ROS (+)   Abdominal (+) + obese,   Peds  Hematology  (+) anemia ,   Anesthesia Other Findings   Reproductive/Obstetrics (+) Pregnancy                             Lab Results  Component Value Date   WBC 6.7 03/03/2016   HGB 10.7 (L) 03/03/2016   HCT 32.2 (L) 03/03/2016   MCV 84.7 03/03/2016   PLT 189 03/03/2016    Anesthesia Physical Anesthesia Plan  ASA: II  Anesthesia Plan: Epidural   Post-op Pain Management:    Induction:   Airway Management Planned: Natural Airway  Additional Equipment:   Intra-op Plan:   Post-operative Plan:   Informed Consent: I have reviewed the patients History and Physical, chart, labs and discussed the procedure including the risks, benefits and alternatives for the proposed anesthesia with the patient or authorized representative who has indicated his/her understanding and acceptance.     Plan Discussed with: Anesthesiologist  Anesthesia Plan Comments:         Anesthesia Quick Evaluation

## 2016-03-04 ENCOUNTER — Encounter (HOSPITAL_COMMUNITY): Payer: Self-pay

## 2016-03-04 DIAGNOSIS — B951 Streptococcus, group B, as the cause of diseases classified elsewhere: Secondary | ICD-10-CM | POA: Diagnosis present

## 2016-03-04 LAB — CBC
HCT: 29 % — ABNORMAL LOW (ref 36.0–46.0)
Hemoglobin: 10 g/dL — ABNORMAL LOW (ref 12.0–15.0)
MCH: 28.7 pg (ref 26.0–34.0)
MCHC: 34.5 g/dL (ref 30.0–36.0)
MCV: 83.3 fL (ref 78.0–100.0)
Platelets: 138 10*3/uL — ABNORMAL LOW (ref 150–400)
RBC: 3.48 MIL/uL — ABNORMAL LOW (ref 3.87–5.11)
RDW: 13.9 % (ref 11.5–15.5)
WBC: 9.8 10*3/uL (ref 4.0–10.5)

## 2016-03-04 MED ORDER — ACETAMINOPHEN 325 MG PO TABS
650.0000 mg | ORAL_TABLET | ORAL | Status: DC | PRN
  Administered 2016-03-04 (×2): 650 mg via ORAL
  Filled 2016-03-04 (×2): qty 2

## 2016-03-04 MED ORDER — ONDANSETRON HCL 4 MG/2ML IJ SOLN: 4.0000 mg | INTRAMUSCULAR | Status: DC | PRN

## 2016-03-04 MED ORDER — ZOLPIDEM TARTRATE 5 MG PO TABS: 5.0000 mg | ORAL_TABLET | Freq: Every evening | ORAL | Status: DC | PRN

## 2016-03-04 MED ORDER — DIBUCAINE 1 % RE OINT: 1.0000 "application " | TOPICAL_OINTMENT | RECTAL | Status: DC | PRN

## 2016-03-04 MED ORDER — PRENATAL MULTIVITAMIN CH
1.0000 | ORAL_TABLET | Freq: Every day | ORAL | Status: DC
  Administered 2016-03-04 – 2016-03-05 (×2): 1 via ORAL
  Filled 2016-03-04 (×2): qty 1

## 2016-03-04 MED ORDER — SENNOSIDES-DOCUSATE SODIUM 8.6-50 MG PO TABS
2.0000 | ORAL_TABLET | ORAL | Status: DC
  Administered 2016-03-04: 2 via ORAL
  Filled 2016-03-04: qty 2

## 2016-03-04 MED ORDER — WITCH HAZEL-GLYCERIN EX PADS: 1.0000 "application " | MEDICATED_PAD | CUTANEOUS | Status: DC | PRN

## 2016-03-04 MED ORDER — IBUPROFEN 600 MG PO TABS
600.0000 mg | ORAL_TABLET | Freq: Four times a day (QID) | ORAL | Status: DC
  Administered 2016-03-04 – 2016-03-05 (×7): 600 mg via ORAL
  Filled 2016-03-04 (×7): qty 1

## 2016-03-04 MED ORDER — TETANUS-DIPHTH-ACELL PERTUSSIS 5-2.5-18.5 LF-MCG/0.5 IM SUSP: 0.5000 mL | Freq: Once | INTRAMUSCULAR | Status: DC

## 2016-03-04 MED ORDER — BENZOCAINE-MENTHOL 20-0.5 % EX AERO: 1.0000 "application " | INHALATION_SPRAY | CUTANEOUS | Status: DC | PRN

## 2016-03-04 MED ORDER — DIPHENHYDRAMINE HCL 25 MG PO CAPS: 25.0000 mg | ORAL_CAPSULE | Freq: Four times a day (QID) | ORAL | Status: DC | PRN

## 2016-03-04 MED ORDER — SIMETHICONE 80 MG PO CHEW: 80.0000 mg | CHEWABLE_TABLET | ORAL | Status: DC | PRN

## 2016-03-04 MED ORDER — ONDANSETRON HCL 4 MG PO TABS: 4.0000 mg | ORAL_TABLET | ORAL | Status: DC | PRN

## 2016-03-04 MED ORDER — COCONUT OIL OIL: 1.0000 "application " | TOPICAL_OIL | Status: DC | PRN

## 2016-03-04 NOTE — Anesthesia Postprocedure Evaluation (Signed)
Anesthesia Post Note  Patient: Stephanie [name]  Procedure(s) Performed: * No procedures listed *  Patient location during evaluation: Mother Baby Anesthesia Type: Epidural Level of consciousness: awake and alert Pain management: pain level controlled Vital Signs Assessment: post-procedure vital signs reviewed and stable Respiratory status: spontaneous breathing Cardiovascular status: stable Postop Assessment: no headache, adequate PO intake, no backache, patient able to bend at knees, epidural receding and no signs of nausea or vomiting Anesthetic complications: no        Last Vitals:  Vitals:   03/04/16 0350 03/04/16 0450  BP: 116/80 (!) 90/53  Pulse: 71 76  Resp: 18 18  Temp: 36.8 C 36.9 C    Last Pain:  Vitals:   03/04/16 0450  TempSrc: Axillary  PainSc: 2    Pain Goal:                 Salome ArntSterling, Dez Stauffer Marie

## 2016-03-04 NOTE — Lactation Note (Addendum)
This note was copied from a baby's chart. Lactation Consultation Note  Patient Name: Stephanie [name]: 03/04/2016 Reason for consult: Initial assessment   Initial consult with Exp BF mom of 11 hour old infant. Infant with 2 BF for 10-60 minutes and 1 void since birth. Mom reports infant BF well after birth and has been sleepy since. Mom reports infant has been spitty at times. Infant was asleep lying beside mom in the bed, she was noted to have repetitive swallowing episode once while I was in the room, no spitting noted. Mom reports she just tried to feed infant and infant not interested. Discussed NB Feeding behaviors and awakening techniques.   Reviewed BF basics and positioning in Taking Care of Baby and Me Booklet. Mom demonstrated hand expression, no colostrum noted from either breast at this time. Talked to mom about importance of hand expression and spoon feeding if infant remains sleepy. Spoons available in the room for mom to use. Mom with large compressible breasts and areola and large diameter everted nipples.   Mom has a DEBP set up, she reports she tried once and she did not see colostrum and was experiencing uterine cramping. Discussed supply and demand and milk coming to volume. Discussed normalcy of uterine cramping with BF/pumping. Feeding log given with instructions for use.   BF Resources Handout and LC Brochure given, mom informed of IP/OP Services, BF Support Groups and LC phone #. [facility] and plans to call and make an appointment after d/c. Enc mom to call out to desk for feeding assistance as needed. Mom declined need for assistance at this time.     Maternal Data Formula Feeding for Exclusion: No Has patient been taught Hand Expression?: Yes Does the patient have breastfeeding experience prior to this delivery?: Yes  Feeding    LATCH Score/Interventions Latch: Too sleepy or reluctant, no latch achieved, no sucking  elicited. Intervention(s): Skin to skin;Teach feeding cues;Waking techniques  Audible Swallowing: None  Type of Nipple: Everted at rest and after stimulation  Comfort (Breast/Nipple): Soft / non-tender           Lactation Tools Discussed/Used WIC Program: Yes Pump Review: Setup, frequency, and cleaning Initiated by:: [name] initiated:: 03/04/16   Consult Status Consult Status: Follow-up [name]: 03/05/16 Follow-up type: In-patient    Silas FloodSharon S Fred Hammes 03/04/2016, 12:35 PM

## 2016-03-04 NOTE — Progress Notes (Signed)
UR chart review completed.  

## 2016-03-05 MED ORDER — IBUPROFEN 600 MG PO TABS: 600.0000 mg | ORAL_TABLET | Freq: Four times a day (QID) | ORAL | 0 refills | Status: DC

## 2016-03-05 NOTE — Progress Notes (Signed)
CSW met with MOB in her first floor room/110 to offer support and complete assessment due to hx of Anx/Dep.  CSW does not see these dx's documented in MOB's chart, but does note feelings of sadness due to separation documented in [facility]  CSW met with MOB who reports feeling fine, denies any history of depressive or anxious symptoms and no PMADs after other deliveries.  CSW provided education regarding PMADs and encouraged her to talk with a medical professional if she has concerns about her emotional health at any time.  MOB agreed.  She reports that she has everything she needs for baby at home and that her husband and mother are her greatest support people.  She reports they have both been here with her during hospitalization.  CSW identifies no need for intervention and no barriers to discharge when MOB and baby are medically ready.

## 2016-03-05 NOTE — Progress Notes (Signed)
Stephanie [name], Tumeka A [name], 31 y.o., 09-03-1985   Post Partum Day 1 Subjective: no complaints, up ad lib, voiding. Breast and bottle feeding.  She denies preeclampsia symptoms such as headaches, blurry vision, chest pain, shortness of breath, palpitations, right upper quadrant pain.  Scant lochia.    Objective: Blood pressure 134/89, pulse (!) 57, temperature 98.3 F (36.8 C), temperature source Oral, resp. rate 19, height 5' (1.524 m), weight 83.6 kg (184 lb 3.2 oz), last menstrual period 05/06/2015, SpO2 98 %, unknown if currently breastfeeding. Vitals:   03/04/16 1806 03/05/16 0600 03/05/16 0626 03/05/16 0830  BP: 135/83  (!) 147/90 134/89  Pulse: 70  61 (!) 57  Resp: 18 19    Temp: 98 F (36.7 C) 98.3 F (36.8 C)    TempSrc: Oral Oral    SpO2:      Weight:      Height:       Physical Exam:  General: alert, cooperative and no distress Lochia: appropriate Uterine Fundus: firm DVT Evaluation: No evidence of DVT seen on physical exam.   Recent Labs  03/03/16 2100 03/04/16 0523  HGB 10.7* 10.0*  HCT 32.2* 29.0*    Assessment/Plan:  -Patient desires early discharge today,  We discussed will check BP trend over next few hours, if sustained high will defer discharge and do preeclampsia labs.  Otherwise she may be able to be discharged today if with normal BPs.  -We reviewed preeclampsia and hypertension precautions in detail.   -Contraception plan is IUD.     LOS: 2 days   Stephanie [name],Stephanie Nonaka WAKURU, MD.  03/05/2016, 8:54 AM

## 2016-03-05 NOTE — Discharge Summary (Signed)
OB Discharge Summary     Patient Name: Stephanie ShoresDarlene A Newton DOB: 06/29/1985 MRN: [id number]  Date of admission: 03/03/2016 Delivering CNM: Gerrit HeckEMLY, JESSICA   Date of discharge: 03/05/2016  Admitting diagnosis: 39wks NST BPP Intrauterine pregnancy: 8259w5d     Secondary diagnosis:  Active Problems:   Term pregnancy   SVD (spontaneous vaginal delivery)   Group beta Strep positive   Obstetric labial laceration, delivered, current hospitalization Labor induction for Biophysical profile of 8/10 (-2 for abnormal fetal heart tracing)    Discharge diagnosis:  Term pregnancy   SVD (spontaneous vaginal delivery)   Group beta Strep positive   Obstetric labial laceration, delivered, current hospitalization                                                                                                 Post partum procedures: None  Augmentation: AROM  Complications: None  Hospital course:  Induction of Labor With Vaginal Delivery   31 y.o. yo [id number] at 8459w5d was admitted to the hospital 03/03/2016 for induction of labor.  Indication for induction: BPP 8/10.  Patient had an uncomplicated labor course as follows: Membrane Rupture Time/Date: 11:15 PM ,03/03/2016   Intrapartum Procedures: Episiotomy: None [1]                                         Lacerations:  Labial [10]  Patient had delivery of a Viable infant.  Information for the patient's newborn:  Cherlynn Kaiserroutman, Girl Daria [[id number]][[id number]]  Delivery Method: Vaginal, Spontaneous Delivery (Filed from Delivery Summary)   03/04/2016  Details of delivery can be found in separate delivery note.  Patient had a routine postpartum course.  She had a transient mild elevation in her BPs that subsequently normalized.  She denied any preeclampsia symptoms and preeclampsia and HTN precautions were reviewed.   Patient is discharged home 03/05/16.  Smart start nurse will follow up with patient for BP check within 1 week from discharge.     Physical exam Vitals:   03/05/16 0626 03/05/16 0830 03/05/16 1230 03/05/16 1633  BP: (!) 147/90 134/89 107/64 126/85  Pulse: 61 (!) 57 79 81  Resp:    18  Temp:    97.3 F (36.3 C)  TempSrc:    Oral  SpO2:    99%  Weight:      Height:       General: alert, cooperative and no distress Lochia: appropriate Uterine Fundus: firm DVT Evaluation: No evidence of DVT seen on physical exam. Labs: Lab Results  Component Value Date   WBC 9.8 03/04/2016   HGB 10.0 (L) 03/04/2016   HCT 29.0 (L) 03/04/2016   MCV 83.3 03/04/2016   PLT 138 (L) 03/04/2016    Prenatal labs: ABO, Rh: --/--/B POS (12/31 1229) Antibody: NEG (12/31 1229) Rubella: !Error!Immune RPR: Non Reactive (12/31 1229)  HBsAg: Negative (05/26 0000)  HIV: Non-reactive (05/26 0000)  GBS: Positive (12/12 0000)    Discharge instruction: per After Visit Summary and "Baby and Me  Booklet".  After visit meds:  Allergies as of 03/05/2016      Reactions   Latex Itching      Medication List    STOP taking these medications   ferrous sulfate 325 (65 FE) MG tablet     TAKE these medications   ibuprofen 600 MG tablet Commonly known as:  ADVIL,MOTRIN Take 1 tablet (600 mg total) by mouth every 6 (six) hours.   VITAFOL GUMMIES 3.33-0.333-34.8 MG Chew Chew 3 each by mouth daily.       Diet: routine diet  Activity: Advance as tolerated. Pelvic rest for 6 weeks.   Outpatient follow up:6 weeks at office.  Smart Start nurse to see patient at home for BP check within 1 week.   Postpartum contraception: IUD  Newborn Data: Live born female, "Journey"  Birth Weight: 7 lb 1.2 oz (3209 g) APGAR: 9, 9  Baby Feeding: Bottle and Breast Disposition:home with mother   03/05/2016 Konrad Felix, MD

## 2016-03-05 NOTE — Lactation Note (Signed)
This note was copied from a baby's chart. Lactation Consultation Note  Patient Name: Stephanie [name] WUJWJ'XToday's Date: 03/05/2016  Follow up visit made.  Mom giving bottles because her milk is not in.  Reviewed supply and demand.  Instructed to always put baby to breast first.  Offered assist but mom states she has no problem latching baby.  [facility]  Paperwork given.   Maternal Data    Feeding    LATCH Score/Interventions                      Lactation Tools Discussed/Used     Consult Status      Huston FoleyMOULDEN, [name] 03/05/2016, 1:47 PM

## 2016-03-05 NOTE — Lactation Note (Signed)
This note was copied from a baby's chart. Lactation Consultation Note  Patient Name: Stephanie [name] ZOXWR'UToday's Date: 03/05/2016 Reason for consult: Follow-up assessment Baby at 41 hr of life and dyad set for D/C. Mom had all the [facility]. Dad brought in the $30 cash and pump was issued to Mom. She is aware of the return date and how to use the pump. She will call for bf support as needed.   Maternal Data    Feeding Nipple Type: Slow - flow  LATCH Score/Interventions                      Lactation Tools Discussed/Used Pump Review: Setup, frequency, and cleaning;Milk Storage;Other (comment) (paperwork completed) Initiated by:: ES Date initiated:: 03/05/16   Consult Status Consult Status: Complete Date: 03/05/16 Follow-up type: Out-patient    [name] 03/05/2016, 6:19 PM

## 2016-03-13 ENCOUNTER — Inpatient Hospital Stay (HOSPITAL_COMMUNITY): Payer: Medicaid Other

## 2016-03-30 ENCOUNTER — Encounter (HOSPITAL_COMMUNITY): Payer: Self-pay

## 2016-03-30 ENCOUNTER — Ambulatory Visit (HOSPITAL_COMMUNITY)
Admission: EM | Admit: 2016-03-30 | Discharge: 2016-03-30 | Disposition: A | Discharge status: Home / Self Care | Payer: Medicaid Other | Attending: Family Medicine | Admitting: Family Medicine

## 2016-03-30 DIAGNOSIS — B3789 Other sites of candidiasis: Secondary | ICD-10-CM | POA: Diagnosis not present

## 2016-03-30 MED ORDER — NYSTATIN 100000 UNIT/GM EX CREA: TOPICAL_CREAM | CUTANEOUS | 1 refills | Status: DC

## 2016-03-30 NOTE — ED Provider Notes (Signed)
CSN: [id number]     Arrival date & time 03/30/16  1217 History   None    Chief Complaint  Patient presents with  . Breast Problem   (Consider location/radiation/quality/duration/timing/severity/associated sxs/prior Treatment) Patient has been breastfeeding for 3 weeks and now she is experiencing some breast tenderness and some swelling of her nipples.  She denies fever.   The history is provided by the patient.  Rash  Location:  Torso Torso rash location:  R breast and L breast Quality: painful and redness   Pain details:    Quality:  Aching   Severity:  Moderate   Onset quality:  Sudden   Duration:  1 day   Timing:  Constant   Progression:  Worsening Severity:  Moderate Onset quality:  Gradual Duration:  1 week Timing:  Constant Progression:  Worsening Chronicity:  New Relieved by:  Nothing   Past Medical History:  Diagnosis Date  . Anemia    Past Surgical History:  Procedure Laterality Date  . NO PAST SURGERIES     Family History  Problem Relation Age of Onset  . Cancer Maternal Aunt   . Diabetes Maternal Grandmother   . Hypertension Maternal Grandmother   . Cancer Paternal Grandmother    Social History  Substance Use Topics  . Smoking status: Never Smoker  . Smokeless tobacco: Never Used  . Alcohol use No   OB History    Gravida Para Term Preterm AB Living   5 3 3   2 3    SAB TAB Ectopic Multiple Live Births       2 0 3     Review of Systems  Constitutional: Negative.   HENT: Negative.   Eyes: Negative.   Respiratory: Negative.   Cardiovascular: Negative.   Gastrointestinal: Negative.   Endocrine: Negative.   Genitourinary: Negative.   Musculoskeletal: Negative.   Skin: Positive for rash.  Allergic/Immunologic: Negative.   Neurological: Negative.   Hematological: Negative.   Psychiatric/Behavioral: Negative.     Allergies  Latex  Home Medications   Prior to Admission medications   Medication Sig Start Date End Date Taking?  Authorizing Provider  ibuprofen (ADVIL,MOTRIN) 600 MG tablet Take 1 tablet (600 mg total) by mouth every 6 (six) hours. 03/05/16  Yes Hoover BrownsEma Kulwa, MD  Prenatal Vit-Fe Phos-FA-Omega (VITAFOL GUMMIES) 3.33-0.333-34.8 MG CHEW Chew 3 each by mouth daily.   Yes Historical Provider, MD  nystatin cream (MYCOSTATIN) Apply to affected area 2 times daily 03/30/16   Deatra CanterWilliam J Oxford, FNP   Meds Ordered and Administered this Visit  Medications - No data to display  BP 128/89 (BP Location: Right Arm)   Pulse 71   Temp 97.9 F (36.6 C) (Oral)   Resp 20   SpO2 100%   Breastfeeding? Yes  No data found.   Physical Exam  Constitutional: She appears well-developed and well-nourished.  HENT:  Head: Normocephalic and atraumatic.  Eyes: EOM are normal. Pupils are equal, round, and reactive to light.  Neck: Normal range of motion. Neck supple.  Cardiovascular: Normal rate, regular rhythm and normal heart sounds.   Pulmonary/Chest: Effort normal and breath sounds normal.  Skin: Rash noted.  Bilateral Nipples with tenderness and mild erythema.  Bilateral Breasts w/o rash, pain, or mastitis  Nursing note and vitals reviewed.   Urgent Care Course     Procedures (including critical care time)  Labs Review Labs Reviewed - No data to display  Imaging Review No results found.   Visual Acuity Review  Right Eye Distance:   Left Eye Distance:   Bilateral Distance:    Right Eye Near:   Left Eye Near:    Bilateral Near:         MDM   1. Candidiasis of breast   2. Breast feeding status of mother    Nystatin cream apply one hour prior to breast feeding Follow up with [facility]      Deatra Canter, FNP 03/30/16 1511

## 2016-03-30 NOTE — ED Triage Notes (Signed)
Pt said her nipples are irritated and pain for about a week. The left nipple seems to be worse. No discharge that she noticed. No fever.

## 2016-11-17 ENCOUNTER — Encounter (HOSPITAL_COMMUNITY): Payer: Self-pay | Admitting: Emergency Medicine

## 2016-11-17 ENCOUNTER — Ambulatory Visit (HOSPITAL_COMMUNITY)
Admission: EM | Admit: 2016-11-17 | Discharge: 2016-11-17 | Disposition: A | Discharge status: Home / Self Care | Payer: Medicaid Other | Attending: Family Medicine | Admitting: Family Medicine

## 2016-11-17 DIAGNOSIS — B3731 Acute candidiasis of vulva and vagina: Secondary | ICD-10-CM

## 2016-11-17 DIAGNOSIS — B373 Candidiasis of vulva and vagina: Secondary | ICD-10-CM | POA: Diagnosis present

## 2016-11-17 DIAGNOSIS — N390 Urinary tract infection, site not specified: Secondary | ICD-10-CM | POA: Diagnosis not present

## 2016-11-17 DIAGNOSIS — B9689 Other specified bacterial agents as the cause of diseases classified elsewhere: Secondary | ICD-10-CM

## 2016-11-17 DIAGNOSIS — R300 Dysuria: Secondary | ICD-10-CM

## 2016-11-17 DIAGNOSIS — N760 Acute vaginitis: Secondary | ICD-10-CM

## 2016-11-17 DIAGNOSIS — R350 Frequency of micturition: Secondary | ICD-10-CM

## 2016-11-17 LAB — POCT URINALYSIS DIP (DEVICE)
Bilirubin Urine: NEGATIVE
Glucose, UA: NEGATIVE mg/dL
Hgb urine dipstick: NEGATIVE
Ketones, ur: NEGATIVE mg/dL
Nitrite: NEGATIVE
PH: 7 (ref 5.0–8.0)
Protein, ur: 30 mg/dL — AB
Specific Gravity, Urine: 1.025 (ref 1.005–1.030)
Urobilinogen, UA: 1 mg/dL (ref 0.0–1.0)

## 2016-11-17 MED ORDER — FLUCONAZOLE 150 MG PO TABS: ORAL_TABLET | ORAL | 0 refills | Status: DC

## 2016-11-17 MED ORDER — CEPHALEXIN 500 MG PO CAPS
500.0000 mg | ORAL_CAPSULE | Freq: Four times a day (QID) | ORAL | 0 refills | Status: DC
Start: 2016-11-17 — End: 2017-09-20

## 2016-11-17 MED ORDER — METRONIDAZOLE 500 MG PO TABS: 500.0000 mg | ORAL_TABLET | Freq: Two times a day (BID) | ORAL | 0 refills | Status: DC

## 2016-11-17 NOTE — ED Triage Notes (Signed)
PT reports vaginal irritation/itching. PT reports dysuria for 3 days.

## 2016-11-17 NOTE — Discharge Instructions (Signed)
Drink plenty of fluids, stay well-hydrated, take your antibiotics and all medicines prescribed today.

## 2016-11-17 NOTE — ED Provider Notes (Signed)
[facility]    CSN: [id number] Arrival date & time: 11/17/16  1246     History   Chief Complaint Chief Complaint  Patient presents with  . Vaginal Itching  . Urinary Tract Infection    HPI Stephanie [name] is a 31 y.o. female.   31 year old female complaining of a Vulvo vaginal burning, itching and irritation. Also complaining of urinary frequency and dysuria. Started within the past 3 days. No history of these infection. She also states she has scant amount of clear vaginal discharge. Tried no medication. Nothing makes it better, nothing makes it worse.      Past Medical History:  Diagnosis Date  . Anemia     Patient Active Problem List   Diagnosis Date Noted  . SVD (spontaneous vaginal delivery) 03/04/2016  . Group beta Strep positive 03/04/2016  . Obstetric labial laceration, delivered, current hospitalization 03/04/2016  . Term pregnancy 03/03/2016  . Non-reassuring electronic fetal monitoring tracing 03/02/2016    Past Surgical History:  Procedure Laterality Date  . NO PAST SURGERIES      OB History    Gravida Para Term Preterm AB Living   SAB TAB Ectopic Multiple Live Births       2 0 3       Home Medications    Prior to Admission medications   Medication Sig Start Date End Date Taking? Authorizing Provider  cephALEXin (KEFLEX) 500 MG capsule Take 1 capsule (500 mg total) by mouth 4 (four) times daily. 11/17/16   Hayden Rasmussen, NP  fluconazole (DIFLUCAN) 150 MG tablet 1 tab po x 1. May repeat in 72 hours if no improvement 11/17/16   Hayden Rasmussen, NP  ibuprofen (ADVIL,MOTRIN) 600 MG tablet Take 1 tablet (600 mg total) by mouth every 6 (six) hours. 03/05/16   Hoover Browns, MD  metroNIDAZOLE (FLAGYL) 500 MG tablet Take 1 tablet (500 mg total) by mouth 2 (two) times daily. X 7 days 11/17/16   Hayden Rasmussen, NP  nystatin cream (MYCOSTATIN) Apply to affected area 2 times daily 03/30/16   Deatra Canter, FNP  Prenatal Vit-Fe Phos-FA-Omega  (VITAFOL GUMMIES) 3.33-0.333-34.8 MG CHEW Chew 3 each by mouth daily.    [provider]    Family History Family History  Problem Relation Age of Onset  . Cancer Maternal Aunt   . Diabetes Maternal Grandmother   . Hypertension Maternal Grandmother   . Cancer Paternal Grandmother     Social History Social History  Substance Use Topics  . Smoking status: Never Smoker  . Smokeless tobacco: Never Used  . Alcohol use No     Allergies   Latex   Review of Systems Review of Systems  Genitourinary: Positive for dysuria, frequency and urgency.  Skin: Negative.   Neurological: Negative.   Psychiatric/Behavioral: Negative.   All other systems reviewed and are negative.    Physical Exam Triage Vital Signs ED Triage Vitals  Enc Vitals Group     BP 11/17/16 1324 118/85     Pulse Rate 11/17/16 1324 70     Resp 11/17/16 1324 16     Temp 11/17/16 1324 98.4 F (36.9 C)     Temp Source 11/17/16 1324 Oral     SpO2 11/17/16 1324 100 %     Weight 11/17/16 1322 145 lb (65.8 kg)     Height 11/17/16 1322  (1.549 m)     Head Circumference --  Peak Flow --      Pain Score 11/17/16 1323 6     Pain Loc --      Pain Edu? --      Excl. in GC? --    No data found.   Updated Vital Signs BP 118/85   Pulse 70   Temp 98.4 F (36.9 C) (Oral)   Resp 16   Ht  (1.549 m)   Wt 145 lb (65.8 kg)   SpO2 100%   BMI 27.40 kg/m   Visual Acuity Right Eye Distance:   Left Eye Distance:   Bilateral Distance:    Right Eye Near:   Left Eye Near:    Bilateral Near:     Physical Exam  Constitutional: She is oriented to person, place, and time. She appears well-developed and well-nourished. No distress.  Eyes: EOM are normal.  Neck: Normal range of motion. Neck supple.  Cardiovascular: Normal rate.   Pulmonary/Chest: Effort normal. No respiratory distress.  Genitourinary: Guaiac stool:   Vaginal discharge found.  Genitourinary Comments: Moderate amount of copious  gray malodorous vaginal discharge. The labia minora surface erythematous. No external lesions seen. Internal exam not performed. Mara, rad tech present.  Musculoskeletal: She exhibits no edema.  Neurological: She is alert and oriented to person, place, and time. She exhibits normal muscle tone.  Skin: Skin is warm and dry.  Psychiatric: She has a normal mood and affect.  Nursing note and vitals reviewed.    UC Treatments / Results  Labs (all labs ordered are listed, but only abnormal results are displayed) Labs Reviewed  POCT URINALYSIS DIP (DEVICE) - Abnormal; Notable for the following:       Result Value   Protein, ur 30 (*)    Leukocytes, UA MODERATE (*)    All other components within normal limits  URINE CULTURE  CERVICOVAGINAL ANCILLARY ONLY    EKG  EKG Interpretation None       Radiology No results found.  Procedures Procedures (including critical care time)  Medications Ordered in UC Medications - No data to display   Initial Impression / Assessment and Plan / UC Course  I have reviewed the triage vital signs and the nursing notes.  Pertinent labs & imaging results that were available during my care of the patient were reviewed by me and considered in my medical decision making (see chart for details).    Drink plenty of fluids, stay well-hydrated, take your antibiotics and all medicines prescribed today.     Final Clinical Impressions(s) / UC Diagnoses   Final diagnoses:  BV (bacterial vaginosis)  Lower urinary tract infectious disease  Candidal vulvovaginitis    New Prescriptions New Prescriptions   CEPHALEXIN (KEFLEX) 500 MG CAPSULE    Take 1 capsule (500 mg total) by mouth 4 (four) times daily.   FLUCONAZOLE (DIFLUCAN) 150 MG TABLET    1 tab po x 1. May repeat in 72 hours if no improvement   METRONIDAZOLE (FLAGYL) 500 MG TABLET    Take 1 tablet (500 mg total) by mouth 2 (two) times daily. X 7 days     Controlled Substance Prescriptions [place]  Controlled Substance Registry consulted? Not Applicable   Hayden Rasmussen, NP 11/17/16 1501

## 2016-11-18 LAB — CERVICOVAGINAL ANCILLARY ONLY
BACTERIAL VAGINITIS: POSITIVE — AB
Candida vaginitis: NEGATIVE
Chlamydia: NEGATIVE
NEISSERIA GONORRHEA: NEGATIVE
Trichomonas: NEGATIVE

## 2016-11-18 LAB — URINE CULTURE: SPECIAL REQUESTS: NORMAL

## 2017-09-20 ENCOUNTER — Ambulatory Visit (HOSPITAL_COMMUNITY)
Admission: EM | Admit: 2017-09-20 | Discharge: 2017-09-20 | Disposition: A | Discharge status: Home / Self Care | Payer: Medicaid Other | Attending: Family Medicine | Admitting: Family Medicine

## 2017-09-20 ENCOUNTER — Encounter (HOSPITAL_COMMUNITY): Payer: Self-pay | Admitting: Emergency Medicine

## 2017-09-20 DIAGNOSIS — Z3202 Encounter for pregnancy test, result negative: Secondary | ICD-10-CM

## 2017-09-20 DIAGNOSIS — N898 Other specified noninflammatory disorders of vagina: Secondary | ICD-10-CM

## 2017-09-20 DIAGNOSIS — Z7251 High risk heterosexual behavior: Secondary | ICD-10-CM

## 2017-09-20 LAB — POCT URINALYSIS DIP (DEVICE)
Bilirubin Urine: NEGATIVE
GLUCOSE, UA: NEGATIVE mg/dL
Hgb urine dipstick: NEGATIVE
KETONES UR: NEGATIVE mg/dL
NITRITE: NEGATIVE
Protein, ur: NEGATIVE mg/dL
Specific Gravity, Urine: 1.02 (ref 1.005–1.030)
Urobilinogen, UA: 1 mg/dL (ref 0.0–1.0)
pH: 7 (ref 5.0–8.0)

## 2017-09-20 LAB — POCT PREGNANCY, URINE: PREG TEST UR: NEGATIVE

## 2017-09-20 MED ORDER — FLUCONAZOLE 150 MG PO TABS: ORAL_TABLET | ORAL | 0 refills | Status: DC

## 2017-09-20 MED ORDER — METRONIDAZOLE 500 MG PO TABS: 500.0000 mg | ORAL_TABLET | Freq: Two times a day (BID) | ORAL | 0 refills | Status: DC

## 2017-09-20 NOTE — ED Provider Notes (Signed)
[facility]   [id number] 09/20/17 Arrival Time: 1528   SUBJECTIVE:  Stephanie [name] is a 32 y.o. female who presents with complain of gradual vaginal discharge, itching, and irritation that began 3 days ago.  Last unprotected sex 4 days ago.  Patient is sexually active with 1 of female partner.  Partner asymptomatic.  Describes discharge as thick,yellow, white and like "cottage cheese."  She has NOT tried OTC medications.  She reports worsening symptoms with wearing underwear and washing.  She reports similar symptoms in the past and was diagnosed with yeast and BV and treated appropriately.  She denies fever, chills, nausea, vomiting, abdominal or pelvic pain, urinary symptoms, vaginal bleeding, dyspareunia, vaginal rashes or lesions.   No LMP recorded. (Menstrual status: Irregular Periods).  ROS: As per HPI.  Past Medical History:  Diagnosis Date  . Anemia    Past Surgical History:  Procedure Laterality Date  . NO PAST SURGERIES     Allergies  Allergen Reactions  . Latex Itching   No current facility-administered medications on file prior to encounter.    No current outpatient medications on file prior to encounter.    Social History   Socioeconomic History  . Marital status: Married    Spouse name: Not on file  . Number of children: Not on file  . Years of education: Not on file  . Highest education level: Not on file  Occupational History  . Not on file  Social Needs  . Financial resource strain: Not on file  . Food insecurity:    Worry: Not on file    Inability: Not on file  . Transportation needs:    Medical: Not on file    Non-medical: Not on file  Tobacco Use  . Smoking status: Never Smoker  . Smokeless tobacco: Never Used  Substance and Sexual Activity  . Alcohol use: No  . Drug use: No  . Sexual activity: Yes    Birth control/protection: None  Lifestyle  . Physical activity:    Days per week: Not on file    Minutes per session: Not on  file  . Stress: Not on file  Relationships  . Social connections:    Talks on phone: Not on file    Gets together: Not on file    Attends religious service: Not on file    Active member of club or organization: Not on file    Attends meetings of clubs or organizations: Not on file    Relationship status: Not on file  . Intimate partner violence:    Fear of current or ex partner: Not on file    Emotionally abused: Not on file    Physically abused: Not on file    Forced sexual activity: Not on file  Other Topics Concern  . Not on file  Social History Narrative  . Not on file   Family History  Problem Relation Age of Onset  . Cancer Maternal Aunt   . Diabetes Maternal Grandmother   . Hypertension Maternal Grandmother   . Cancer Paternal Grandmother     OBJECTIVE:  Vitals:   09/20/17 1546  BP: 107/64  Pulse: 95  Resp: 18  Temp: 98.2 F (36.8 C)  TempSrc: Oral  SpO2: 100%     General appearance: alert, cooperative, appears stated age and no distress Throat: lips, mucosa, and tongue normal; teeth and gums normal Lungs: CTA bilaterally without adventitious breath sounds Heart: regular rate and rhythm.  Radial pulses 2+ symmetrical  bilaterally Back: no CVA tenderness Abdomen: soft, non-tender; bowel sounds normal;  no guarding GU: declines.  Agrees to vaginal self-swab Skin: warm and dry Psychological:  Alert and cooperative. Normal mood and affect.  Results for orders placed or performed during the hospital encounter of 09/20/17  POCT urinalysis dip (device)  Result Value Ref Range   Glucose, UA NEGATIVE NEGATIVE mg/dL   Bilirubin Urine NEGATIVE NEGATIVE   Ketones, ur NEGATIVE NEGATIVE mg/dL   Specific Gravity, Urine 1.020 1.005 - 1.030   Hgb urine dipstick NEGATIVE NEGATIVE   pH 7.0 5.0 - 8.0   Protein, ur NEGATIVE NEGATIVE mg/dL   Urobilinogen, UA 1.0 0.0 - 1.0 mg/dL   Nitrite NEGATIVE NEGATIVE   Leukocytes, UA SMALL (A) NEGATIVE  Pregnancy, urine POC    Result Value Ref Range   Preg Test, Ur NEGATIVE NEGATIVE    Labs Reviewed  POCT URINALYSIS DIP (DEVICE) - Abnormal; Notable for the following components:      Result Value   Leukocytes, UA SMALL (*)    All other components within normal limits  POCT PREGNANCY, URINE  CERVICOVAGINAL ANCILLARY ONLY    ASSESSMENT & PLAN:  1. Vaginal discharge   2. Unprotected sex     Meds ordered this encounter  Medications  . fluconazole (DIFLUCAN) 150 MG tablet    Sig: 1 tab po x 1. May repeat in 72 hours if no improvement    Dispense:  2 tablet    Refill:  0    Order Specific Question:   Supervising Provider    Answer:   Isa RankinMURRAY, LAURA WILSON [[id number]]  . metroNIDAZOLE (FLAGYL) 500 MG tablet    Sig: Take 1 tablet (500 mg total) by mouth 2 (two) times daily. X 7 days    Dispense:  14 tablet    Refill:  0    Order Specific Question:   Supervising Provider    Answer:   Isa RankinMURRAY, LAURA WILSON [[id number]]    Pending: Labs Reviewed  POCT URINALYSIS DIP (DEVICE) - Abnormal; Notable for the following components:      Result Value   Leukocytes, UA SMALL (*)    All other components within normal limits  POCT PREGNANCY, URINE  CERVICOVAGINAL ANCILLARY ONLY   Declines treatment for gonorrhea and chlamydia Vaginal self-swab obtained Prescribed metronidazole 500 mg twice daily for 7 days (do not take while consuming alcohol) Prescribed diflucan 150 mg once daily and then second dose 72 hours later Take medications as prescribed and to completion We will follow up with you regarding the results of your test If tests are positive, please abstain from sexual activity for at least 7 days and notify partners Follow up with PCP if symptoms persists Return here or go to ER if you have any new or worsening symptoms    Reviewed expectations re: course of current medical issues. Questions answered. Outlined signs and symptoms indicating need for more acute intervention. Patient verbalized  understanding. After Visit Summary given.       Rennis HardingWurst, Hideko Esselman, PA-C 09/20/17 1625

## 2017-09-20 NOTE — Discharge Instructions (Addendum)
UPT negative Declines treatment for gonorrhea and chlamydia Vaginal self-swab obtained Prescribed metronidazole 500 mg twice daily for 7 days (do not take while consuming alcohol) Prescribed diflucan 150 mg once daily and then second dose 72 hours later Take medications as prescribed and to completion We will follow up with you regarding the results of your test If tests are positive, please abstain from sexual activity for at least 7 days and notify partners Follow up with PCP if symptoms persists Return here or go to ER if you have any new or worsening symptoms

## 2017-09-20 NOTE — ED Triage Notes (Signed)
Pt sts vaginal discharge and irritation; pt sts thinks is yeast

## 2017-09-21 LAB — CERVICOVAGINAL ANCILLARY ONLY
BACTERIAL VAGINITIS: POSITIVE — AB
CANDIDA VAGINITIS: POSITIVE — AB
CHLAMYDIA, DNA PROBE: NEGATIVE
Neisseria Gonorrhea: NEGATIVE
Trichomonas: NEGATIVE

## 2017-11-09 ENCOUNTER — Emergency Department (HOSPITAL_COMMUNITY): Payer: Medicaid Other

## 2017-11-09 ENCOUNTER — Emergency Department (HOSPITAL_COMMUNITY)
Admission: EM | Admit: 2017-11-09 | Discharge: 2017-11-10 | Disposition: A | Discharge status: Home / Self Care | Payer: Medicaid Other | Attending: Emergency Medicine | Admitting: Emergency Medicine

## 2017-11-09 ENCOUNTER — Other Ambulatory Visit: Payer: Self-pay

## 2017-11-09 DIAGNOSIS — R519 Headache, unspecified: Secondary | ICD-10-CM

## 2017-11-09 DIAGNOSIS — R197 Diarrhea, unspecified: Secondary | ICD-10-CM | POA: Insufficient documentation

## 2017-11-09 DIAGNOSIS — R51 Headache: Secondary | ICD-10-CM | POA: Insufficient documentation

## 2017-11-09 DIAGNOSIS — R1084 Generalized abdominal pain: Secondary | ICD-10-CM

## 2017-11-09 DIAGNOSIS — R42 Dizziness and giddiness: Secondary | ICD-10-CM | POA: Insufficient documentation

## 2017-11-09 DIAGNOSIS — R079 Chest pain, unspecified: Secondary | ICD-10-CM | POA: Insufficient documentation

## 2017-11-09 LAB — BASIC METABOLIC PANEL
ANION GAP: 10 (ref 5–15)
BUN: 18 mg/dL (ref 6–20)
CO2: 27 mmol/L (ref 22–32)
CREATININE: 0.79 mg/dL (ref 0.44–1.00)
Calcium: 9.7 mg/dL (ref 8.9–10.3)
Chloride: 103 mmol/L (ref 98–111)
GFR calc non Af Amer: >60 mL/min (ref >60)
Glucose, Bld: 96 mg/dL (ref 70–99)
Potassium: 3.8 mmol/L (ref 3.5–5.1)
Sodium: 140 mmol/L (ref 135–145)

## 2017-11-09 LAB — CBC
HEMATOCRIT: 35.9 % — AB (ref 36.0–46.0)
HEMOGLOBIN: 11.6 g/dL — AB (ref 12.0–15.0)
MCH: 28.1 pg (ref 26.0–34.0)
MCHC: 32.3 g/dL (ref 30.0–36.0)
MCV: 86.9 fL (ref 78.0–100.0)
Platelets: 226 10*3/uL (ref 150–400)
RBC: 4.13 MIL/uL (ref 3.87–5.11)
RDW: 13 % (ref 11.5–15.5)
WBC: 5.3 10*3/uL (ref 4.0–10.5)

## 2017-11-09 LAB — I-STAT BETA HCG BLOOD, ED (MC, WL, AP ONLY): I-stat hCG, quantitative: <5 m[IU]/mL (ref <5)

## 2017-11-09 LAB — HEPATIC FUNCTION PANEL
ALBUMIN: 4 g/dL (ref 3.5–5.0)
ALK PHOS: 65 U/L (ref 38–126)
ALT: 13 U/L (ref 0–44)
AST: 16 U/L (ref 15–41)
Bilirubin, Direct: <0.1 mg/dL (ref 0.0–0.2)
TOTAL PROTEIN: 7.8 g/dL (ref 6.5–8.1)
Total Bilirubin: 0.6 mg/dL (ref 0.3–1.2)

## 2017-11-09 LAB — CBG MONITORING, ED: Glucose-Capillary: 80 mg/dL (ref 70–99)

## 2017-11-09 MED ORDER — ACETAMINOPHEN 325 MG PO TABS
650.0000 mg | ORAL_TABLET | Freq: Once | ORAL | Status: AC
  Administered 2017-11-09: 650 mg via ORAL
  Filled 2017-11-09: qty 2

## 2017-11-09 NOTE — ED Triage Notes (Signed)
Patient arrives with c/o weakness and lightheadedness that started today. +blurry vision, + headache and took tylenol. Denies N/V. Denies CP/SOB.

## 2017-11-09 NOTE — ED Provider Notes (Signed)
[facility] Emergency Department Provider Note MRN:  [id number]  Arrival date & time: 11/10/17     Chief Complaint   Dizziness and Blurred Vision   History of Present Illness   Stephanie [name] is a 32 y.o. year-old female with a history of anemia presenting to the ED with chief complaint of dizziness.  Yesterday was experiencing intermittent lightheadedness associated with transient blurred vision.  The symptoms continued today.  Yesterday also experienced mild epigastric pain, described as sharp, lasting only few minutes.  Associated with watery diarrhea yesterday.  Denies fever, no headache, endorsing some central chest tightness today.  No shortness of breath, no lower abdominal pain, no vaginal bleeding or discharge, no dysuria.  Feeling general malaise.  Review of Systems  A complete 10 system review of systems was obtained and all systems are negative except as noted in the HPI and PMH.   Patient's Health History    Past Medical History:  Diagnosis Date  . Anemia     Past Surgical History:  Procedure Laterality Date  . NO PAST SURGERIES      Family History  Problem Relation Age of Onset  . Cancer Maternal Aunt   . Diabetes Maternal Grandmother   . Hypertension Maternal Grandmother   . Cancer Paternal Grandmother     Social History   Socioeconomic History  . Marital status: Married    Spouse name: Not on file  . Number of children: Not on file  . Years of education: Not on file  . Highest education level: Not on file  Occupational History  . Not on file  Social Needs  . Financial resource strain: Not on file  . Food insecurity:    Worry: Not on file    Inability: Not on file  . Transportation needs:    Medical: Not on file    Non-medical: Not on file  Tobacco Use  . Smoking status: Never Smoker  . Smokeless tobacco: Never Used  Substance and Sexual Activity  . Alcohol use: No  . Drug use: No  . Sexual activity: Yes    Birth  control/protection: None  Lifestyle  . Physical activity:    Days per week: Not on file    Minutes per session: Not on file  . Stress: Not on file  Relationships  . Social connections:    Talks on phone: Not on file    Gets together: Not on file    Attends religious service: Not on file    Active member of club or organization: Not on file    Attends meetings of clubs or organizations: Not on file    Relationship status: Not on file  . Intimate partner violence:    Fear of current or ex partner: Not on file    Emotionally abused: Not on file    Physically abused: Not on file    Forced sexual activity: Not on file  Other Topics Concern  . Not on file  Social History Narrative  . Not on file     Physical Exam  Vital Signs and Nursing Notes reviewed Vitals:   11/09/17 2002 11/09/17 2246  BP: (!) 118/91 110/89  Pulse: 89 76  Resp: (!) 22 18  Temp:    SpO2: 100% 100%    CONSTITUTIONAL: Well-appearing, NAD NEURO:  Alert and oriented x 3, no focal deficits EYES:  eyes equal and reactive ENT/NECK:  no LAD, no JVD CARDIO: Regular rate, well-perfused, normal S1 and S2 PULM:  CTAB no wheezing or rhonchi GI/GU:  normal bowel sounds, non-distended, non-tender MSK/SPINE:  No gross deformities, no edema SKIN:  no rash, atraumatic PSYCH:  Appropriate speech and behavior  Diagnostic and Interventional Summary    EKG Interpretation  Date/Time:  Monday November 09 2017 19:29:08 EDT Ventricular Rate:  75 PR Interval:    QRS Duration: 103 QT Interval:  362 QTC Calculation: 405 R Axis:   56 Text Interpretation:  Sinus rhythm Low voltage, precordial leads Nonspecific T abnormalities, anterior leads Confirmed by Kennis Carina [phone]) on 11/09/2017 11:49:37 PM      Labs Reviewed  CBC - Abnormal; Notable for the following components:      Result Value   Hemoglobin 11.6 (*)    HCT 35.9 (*)    All other components within normal limits  URINALYSIS, ROUTINE W REFLEX MICROSCOPIC -  Abnormal; Notable for the following components:   Leukocytes, UA SMALL (*)    All other components within normal limits  BASIC METABOLIC PANEL  HEPATIC FUNCTION PANEL  URINALYSIS, ROUTINE W REFLEX MICROSCOPIC  CBG MONITORING, ED  I-STAT BETA HCG BLOOD, ED ([facility], [facility], [facility] ONLY)    DG Chest 2 View  Final Result      Medications  acetaminophen (TYLENOL) tablet 650 mg (650 mg Oral Given 11/09/17 2101)     Procedures Critical Care  ED Course and Medical Decision Making  I have reviewed the triage vital signs and the nursing notes.  Pertinent labs & imaging results that were available during my care of the patient were reviewed by me and considered in my medical decision making (see below for details).    Viral illness versus symptomatic anemia versus mild dehydration versus metabolic disarray in this otherwise healthy 32 year old female.  Labs, urinalysis, chest x-ray, reassess.  Work-up unremarkable, favoring viral illness, encouraged follow-up with PCP.  Prescription for Compazine for symptoms.  After the discussed management above, the patient was determined to be safe for discharge.  The patient was in agreement with this plan and all questions regarding their care were answered.  ED return precautions were discussed and the patient will return to the ED with any significant worsening of condition.  Elmer Sow. Pilar Plate, MD [facility] [email or web address]  Final Clinical Impressions(s) / ED Diagnoses     ICD-10-CM   1. Dizziness R42   2. Chest pain R07.9 DG Chest 2 View    DG Chest 2 View  3. Nonintractable headache, unspecified chronicity pattern, unspecified headache type R51   4. Generalized abdominal pain R10.84   5. Diarrhea, unspecified type R19.7     ED Discharge Orders         Ordered    prochlorperazine (COMPAZINE) 10 MG tablet  2 times daily PRN     11/10/17 0024             Sabas Sous, MD 11/10/17 [phone]

## 2017-11-10 LAB — URINALYSIS, ROUTINE W REFLEX MICROSCOPIC
BACTERIA UA: NONE SEEN
BILIRUBIN URINE: NEGATIVE
Glucose, UA: NEGATIVE mg/dL
Hgb urine dipstick: NEGATIVE
Ketones, ur: NEGATIVE mg/dL
Nitrite: NEGATIVE
PH: 6 (ref 5.0–8.0)
Protein, ur: NEGATIVE mg/dL
SPECIFIC GRAVITY, URINE: 1.012 (ref 1.005–1.030)

## 2017-11-10 MED ORDER — PROCHLORPERAZINE MALEATE 10 MG PO TABS: 10.0000 mg | ORAL_TABLET | Freq: Two times a day (BID) | ORAL | 0 refills | Status: DC | PRN

## 2017-11-10 NOTE — Discharge Instructions (Addendum)
You were evaluated in the Emergency Department and after careful evaluation, we did not find any emergent condition requiring admission or further testing in the hospital. ° °Please return to the Emergency Department if you experience any worsening of your condition.  We encourage you to follow up with a primary care provider.  Thank you for allowing us to be a part of your care. °

## 2018-06-04 ENCOUNTER — Telehealth: Payer: Self-pay | Admitting: Obstetrics and Gynecology

## 2018-06-04 DIAGNOSIS — B379 Candidiasis, unspecified: Secondary | ICD-10-CM

## 2018-06-04 NOTE — Telephone Encounter (Signed)
The patient called in to make an appointment, feels she has a yeast infection. Sending a message to the nurse

## 2018-06-07 ENCOUNTER — Telehealth: Payer: Self-pay | Admitting: *Deleted

## 2018-06-07 MED ORDER — FLUCONAZOLE 150 MG PO TABS: ORAL_TABLET | ORAL | 0 refills | Status: DC

## 2018-06-07 NOTE — Telephone Encounter (Signed)
Stephanie [name] called and left a messge she wants a RX for yeast.

## 2018-06-07 NOTE — Telephone Encounter (Signed)
Returned pt call regarding her yeast infection.  Pt reports thick, white, chunky discharge and vaginal itching.  Pt reports that she frequently gets yeast infections.  Diflucan sent to the [facility] on [address] per protocol.  Pt verbalized understanding.

## 2018-06-07 NOTE — Telephone Encounter (Signed)
Per chart review patient has been called re: this complaint and rx sent - see other telephone note

## 2019-03-16 ENCOUNTER — Ambulatory Visit (HOSPITAL_COMMUNITY)
Admission: EM | Admit: 2019-03-16 | Discharge: 2019-03-16 | Disposition: A | Discharge status: Home / Self Care | Payer: Medicaid Other | Attending: Family Medicine | Admitting: Family Medicine

## 2019-03-16 ENCOUNTER — Other Ambulatory Visit: Payer: Self-pay

## 2019-03-16 ENCOUNTER — Encounter (HOSPITAL_COMMUNITY): Payer: Self-pay

## 2019-03-16 DIAGNOSIS — N3090 Cystitis, unspecified without hematuria: Secondary | ICD-10-CM | POA: Diagnosis present

## 2019-03-16 DIAGNOSIS — R300 Dysuria: Secondary | ICD-10-CM | POA: Diagnosis present

## 2019-03-16 LAB — POCT URINALYSIS DIP (DEVICE)
Bilirubin Urine: NEGATIVE
Glucose, UA: NEGATIVE mg/dL
Ketones, ur: NEGATIVE mg/dL
Nitrite: NEGATIVE
Protein, ur: NEGATIVE mg/dL
Specific Gravity, Urine: 1.025 (ref 1.005–1.030)
Urobilinogen, UA: 0.2 mg/dL (ref 0.0–1.0)
pH: 6.5 (ref 5.0–8.0)

## 2019-03-16 MED ORDER — CEPHALEXIN 500 MG PO CAPS: 500.0000 mg | ORAL_CAPSULE | Freq: Two times a day (BID) | ORAL | 0 refills | Status: DC

## 2019-03-16 NOTE — ED Notes (Signed)
Patient gone to bathroom

## 2019-03-16 NOTE — ED Provider Notes (Signed)
[facility]    ASSESSMENT & PLAN:  1. Dysuria   2. Cystitis     Begin: Meds ordered this encounter  Medications  . cephALEXin (KEFLEX) 500 MG capsule    Sig: Take 1 capsule (500 mg total) by mouth 2 (two) times daily.    Dispense:  10 capsule    Refill:  0    No signs of pyelonephritis; s/s discussed should she develop. No concern for STI. Urine culture sent. Will notify patient when results available. Will follow up with her PCP or here if not showing improvement over the next 48 hours, sooner if needed.  Outlined signs and symptoms indicating need for more acute intervention. Patient verbalized understanding. After Visit Summary given.  SUBJECTIVE:  Stephanie [name] is a 34 y.o. female who complains of dysuria for the past 24 hours; questions mild symptoms over past week. Without associated flank pain, fever, chills, vaginal discharge or bleeding. Gross hematuria: not present. No specific aggravating or alleviating factors reported. No LE edema. Normal PO intake without n/v/d. Without specific abdominal pain. Ambulatory without difficulty. OTC treatment: none reported. H/O UTI: yes.  LMP: Patient's last menstrual period was 02/03/2019.  ROS: As in HPI.  OBJECTIVE:  Vitals:   03/16/19 1049 03/16/19 1051  BP:  135/80  Pulse:  78  Resp:  18  Temp:  97.9 F (36.6 C)  TempSrc:  Oral  SpO2:  100%  Weight: 80.3 kg    General appearance: alert; no distress Lungs: unlabored respirations Abdomen: soft Extremities: no edema; symmetrical with no gross deformities Skin: warm and dry Neurologic: normal gait Psychological: alert and cooperative; normal mood and affect  Labs Reviewed  POCT URINALYSIS DIP (DEVICE) - Abnormal; Notable for the following components:      Result Value   Hgb urine dipstick SMALL (*)    Leukocytes,Ua SMALL (*)    All other components within normal limits  URINE CULTURE    Allergies  Allergen Reactions  . Pineapple  Anaphylaxis  . Latex Itching    Past Medical History:  Diagnosis Date  . Anemia    Social History   Socioeconomic History  . Marital status: Married    Spouse name: Not on file  . Number of children: Not on file  . Years of education: Not on file  . Highest education level: Not on file  Occupational History  . Not on file  Tobacco Use  . Smoking status: Never Smoker  . Smokeless tobacco: Never Used  Substance and Sexual Activity  . Alcohol use: No  . Drug use: No  . Sexual activity: Yes    Birth control/protection: None  Other Topics Concern  . Not on file  Social History Narrative  . Not on file   Social Determinants of Health   Financial Resource Strain:   . Difficulty of Paying Living Expenses: Not on file  Food Insecurity:   . Worried About Programme researcher, broadcasting/film/video in the Last Year: Not on file  . Ran Out of Food in the Last Year: Not on file  Transportation Needs:   . Lack of Transportation (Medical): Not on file  . Lack of Transportation (Non-Medical): Not on file  Physical Activity:   . Days of Exercise per Week: Not on file  . Minutes of Exercise per Session: Not on file  Stress:   . Feeling of Stress : Not on file  Social Connections:   . Frequency of Communication with Friends and Family: Not on  file  . Frequency of Social Gatherings with Friends and Family: Not on file  . Attends Religious Services: Not on file  . Active Member of Clubs or Organizations: Not on file  . Attends Archivist Meetings: Not on file  . Marital Status: Not on file  Intimate Partner Violence:   . Fear of Current or Ex-Partner: Not on file  . Emotionally Abused: Not on file  . Physically Abused: Not on file  . Sexually Abused: Not on file   Family History  Problem Relation Age of Onset  . Cancer Maternal Aunt   . Diabetes Maternal Grandmother   . Hypertension Maternal Grandmother   . Cancer Paternal Izetta Dakin, MD 03/16/19 [phone]

## 2019-03-16 NOTE — ED Triage Notes (Signed)
Pt states she has a UTI x 1 week.  Pt states she has pain when she voids.

## 2019-03-17 LAB — URINE CULTURE: Culture: <10000 — AB

## 2020-01-06 ENCOUNTER — Ambulatory Visit
Admission: EM | Admit: 2020-01-06 | Discharge: 2020-01-06 | Disposition: A | Discharge status: Home / Self Care | Payer: Medicaid Other | Attending: Family Medicine | Admitting: Family Medicine

## 2020-01-06 ENCOUNTER — Other Ambulatory Visit: Payer: Self-pay

## 2020-01-06 DIAGNOSIS — R300 Dysuria: Secondary | ICD-10-CM

## 2020-01-06 DIAGNOSIS — B349 Viral infection, unspecified: Secondary | ICD-10-CM

## 2020-01-06 DIAGNOSIS — N3001 Acute cystitis with hematuria: Secondary | ICD-10-CM | POA: Diagnosis present

## 2020-01-06 LAB — POCT URINALYSIS DIP (MANUAL ENTRY)
Bilirubin, UA: NEGATIVE
Glucose, UA: NEGATIVE mg/dL
Nitrite, UA: NEGATIVE
Protein Ur, POC: 100 mg/dL — AB
Spec Grav, UA: 1.025 (ref 1.010–1.025)
Urobilinogen, UA: 1 E.U./dL
pH, UA: 7 (ref 5.0–8.0)

## 2020-01-06 MED ORDER — NITROFURANTOIN MONOHYD MACRO 100 MG PO CAPS: 100.0000 mg | ORAL_CAPSULE | Freq: Two times a day (BID) | ORAL | 0 refills | Status: AC

## 2020-01-06 NOTE — Discharge Instructions (Addendum)
Your respiratory panel results within the next 48 to 72 hours.  Your urine culture will also result around that time.  If any changes in therapy are warranted, prescribed at that time.  For now take Macrobid 100 mg twice daily for 7 days for UTI symptoms.  Hydrate well for viral symptoms.  If fever develops take ibuprofen or Tylenol.  For cough over-the-counter Delsym is appropriate.

## 2020-01-06 NOTE — ED Provider Notes (Signed)
[facility]    CSN: [id number] Arrival date & time: 01/06/20  1856      History   Chief Complaint Chief Complaint  Patient presents with  . Urinary Tract Infection  . Cough    HPI Stephanie [name] is a 34 y.o. female.   HPI  Patient presents today with a concern of urinary frequency and urge x1 day.  Patient reports that she has a history of recurrent urinary tract infections.  She denies any fever, nausea, vomiting or abdominal pain or flank pain.  Unrelated patient is experiencing a cough and she is concerned as she lost taste and smell over a week ago.  He has been without fever and has not had any other symptoms.  Past Medical History:  Diagnosis Date  . Anemia     Patient Active Problem List   Diagnosis Date Noted  . SVD (spontaneous vaginal delivery) 03/04/2016  . Group beta Strep positive 03/04/2016  . Obstetric labial laceration, delivered, current hospitalization 03/04/2016  . Term pregnancy 03/03/2016  . Non-reassuring electronic fetal monitoring tracing 03/02/2016    Past Surgical History:  Procedure Laterality Date  . NO PAST SURGERIES      OB History    Gravida  5   Para  3   Term  3   Preterm      AB  2   Living  3     SAB      TAB      Ectopic  2   Multiple  0   Live Births  3            Home Medications    Prior to Admission medications   Not on File    Family History Family History  Problem Relation Age of Onset  . Cancer Maternal Aunt   . Diabetes Maternal Grandmother   . Hypertension Maternal Grandmother   . Cancer Paternal Grandmother     Social History Social History   Tobacco Use  . Smoking status: Never Smoker  . Smokeless tobacco: Never Used  Substance Use Topics  . Alcohol use: No  . Drug use: No     Allergies   Pineapple and Latex   Review of Systems Review of Systems Pertinent negatives listed in HPI   Physical Exam Triage Vital Signs ED Triage Vitals  Enc Vitals  Group     BP 01/06/20 1909 112/80     Pulse Rate 01/06/20 1909 76     Resp 01/06/20 1909 20     Temp 01/06/20 1909 97.7 F (36.5 C)     Temp Source 01/06/20 1909 Oral     SpO2 01/06/20 1909 98 %     Weight --      Height --      Head Circumference --      Peak Flow --      Pain Score 01/06/20 1915 5     Pain Loc --      Pain Edu? --      Excl. in GC? --    No data found.  Updated Vital Signs BP 112/80 (BP Location: Left Arm)   Pulse 76   Temp 97.7 F (36.5 C) (Oral)   Resp 20   LMP 01/01/2020   SpO2 98%   Breastfeeding No   Visual Acuity Right Eye Distance:   Left Eye Distance:   Bilateral Distance:    Right Eye Near:   Left Eye Near:    Bilateral  Near:     Physical Exam General appearance: alert, well developed, well nourished, cooperative  Head: Normocephalic, without obvious abnormality, atraumatic Respiratory: Respirations even and unlabored, normal respiratory rate Heart: rate and rhythm normal. No gallop or murmurs noted on exam  Abdomen: BS +, no distention, no rebound tenderness, or no mass Extremities: No gross deformities Skin: Skin color, texture, turgor normal. No rashes seen  Psych: Appropriate mood and affect.  UC Treatments / Results  Labs (all labs ordered are listed, but only abnormal results are displayed) Labs Reviewed  POCT URINE PREGNANCY  POCT URINALYSIS DIP (MANUAL ENTRY)    EKG   Radiology No results found.  Procedures Procedures (including critical care time)  Medications Ordered in UC Medications - No data to display  Initial Impression / Assessment and Plan / UC Course  I have reviewed the triage vital signs and the nursing notes.  Pertinent labs & imaging results that were available during my care of the patient were reviewed by me and considered in my medical decision making (see chart for details).     Patient's history of recurrent UTIs treating empirically with Macrobid 100 mg twice daily x7 days while awaiting  culture.  Encourage symptomatic management of viral symptoms while we await the respiratory panel. Final Clinical Impressions(s) / UC Diagnoses   Final diagnoses:  Viral illness  Acute cystitis with hematuria     Discharge Instructions     Your respiratory panel results within the next 48 to 72 hours.  Your urine culture will also result around that time.  If any changes in therapy are warranted, prescribed at that time.  For now take Macrobid 100 mg twice daily for 7 days for UTI symptoms.  Hydrate well for viral symptoms.  If fever develops take ibuprofen or Tylenol.  For cough over-the-counter Delsym is appropriate.    ED Prescriptions    Medication Sig Dispense Auth. Provider   nitrofurantoin, macrocrystal-monohydrate, (MACROBID) 100 MG capsule Take 1 capsule (100 mg total) by mouth 2 (two) times daily for 7 days. 14 capsule Bing Neighbors, FNP     PDMP not reviewed this encounter.   Bing Neighbors, FNP 01/06/20 1939

## 2020-01-06 NOTE — ED Triage Notes (Signed)
Pt c/o bladder pressure with burning on urination and feels like she can't empty her bladder and just dribbles when she urinates that started today. Pt states lost her taste and smell over a month ago but developed a cough this week.

## 2020-01-09 ENCOUNTER — Telehealth (HOSPITAL_COMMUNITY): Payer: Self-pay | Admitting: Emergency Medicine

## 2020-01-09 LAB — URINE CULTURE

## 2020-01-09 LAB — COVID-19, FLU A+B AND RSV
Influenza A, NAA: NOT DETECTED
Influenza B, NAA: NOT DETECTED
RSV, NAA: NOT DETECTED
SARS-CoV-2, NAA: NOT DETECTED

## 2020-01-09 MED ORDER — FLUCONAZOLE 150 MG PO TABS: 150.0000 mg | ORAL_TABLET | Freq: Once | ORAL | 0 refills | Status: AC

## 2020-01-09 NOTE — Telephone Encounter (Signed)
Patient called stating she gets a yeast infection with antibiotics and was not prescribed anything at time of visit for this.  States she did not mention this to the provider at the time of her visit.  Will send protocol Diflucan to pharmacy of request.

## 2020-07-31 ENCOUNTER — Encounter: Payer: Self-pay | Admitting: *Deleted

## 2022-11-24 ENCOUNTER — Emergency Department (HOSPITAL_COMMUNITY): Payer: Commercial Managed Care - PPO

## 2022-11-24 ENCOUNTER — Other Ambulatory Visit: Payer: Self-pay

## 2022-11-24 ENCOUNTER — Emergency Department (HOSPITAL_COMMUNITY)
Admission: EM | Admit: 2022-11-24 | Discharge: 2022-11-25 | Disposition: A | Discharge status: Home / Self Care | Payer: Commercial Managed Care - PPO | Attending: Emergency Medicine | Admitting: Emergency Medicine

## 2022-11-24 ENCOUNTER — Encounter (HOSPITAL_COMMUNITY): Payer: Self-pay

## 2022-11-24 DIAGNOSIS — Z91040 Latex allergy status: Secondary | ICD-10-CM | POA: Diagnosis not present

## 2022-11-24 DIAGNOSIS — Y92512 Supermarket, store or market as the place of occurrence of the external cause: Secondary | ICD-10-CM | POA: Diagnosis not present

## 2022-11-24 DIAGNOSIS — M79605 Pain in left leg: Secondary | ICD-10-CM | POA: Diagnosis present

## 2022-11-24 DIAGNOSIS — W010XXA Fall on same level from slipping, tripping and stumbling without subsequent striking against object, initial encounter: Secondary | ICD-10-CM | POA: Insufficient documentation

## 2022-11-24 DIAGNOSIS — W19XXXA Unspecified fall, initial encounter: Secondary | ICD-10-CM

## 2022-11-24 NOTE — ED Provider Notes (Incomplete)
[place] EMERGENCY DEPARTMENT AT [facility] Provider Note   CSN: [id number] Arrival date & time: 11/24/22  1551     History Chief Complaint  Patient presents with  . Back Pain  . Leg Pain    HPI Stephanie [name] is a 37 y.o. female presenting for .     Patient's recorded medical, surgical, social, medication list and allergies were reviewed in the Snapshot window as part of the initial history.   Review of Systems   Review of Systems  Physical Exam Updated Vital Signs BP 120/85 (BP Location: Right Arm)   Pulse 70   Temp 98 F (36.7 C)   Resp 19   Ht 5\' 1"  (1.549 m)   Wt 90.7 kg   SpO2 100%   BMI 37.79 kg/m  Physical Exam   ED Course/ Medical Decision Making/ A&P    Procedures Procedures   Medications Ordered in ED Medications - No data to display  Medical Decision Making:    Stephanie [name] is a 37 y.o. female who presented to the ED today with *** detailed above.     {crccomplexity:27900}  Complete initial physical exam performed, notably the patient  was ***.      Reviewed and confirmed nursing documentation for past medical history, family history, social history.    Initial Assessment:   With the patient's presentation of ***, most likely diagnosis is ***. Other diagnoses were considered including (but not limited to) ***. These are considered less likely due to history of present illness and physical exam findings.   {crccopa:27899}  Initial Plan:  ***  ***Screening labs including CBC and Metabolic panel to evaluate for infectious or metabolic etiology of disease.  ***Urinalysis with reflex culture ordered to evaluate for UTI or relevant urologic/nephrologic pathology.  ***CXR to evaluate for structural/infectious intrathoracic pathology.  ***EKG to evaluate for cardiac pathology. Objective evaluation as below reviewed with plan for close reassessment  Initial Study Results:   Laboratory  All laboratory results reviewed  without evidence of clinically relevant pathology.   ***Exceptions include: ***   ***EKG EKG was reviewed independently. Rate, rhythm, axis, intervals all examined and without medically relevant abnormality. ST segments without concerns for elevations.    Radiology  All images reviewed independently. ***Agree with radiology report at this time.   DG Knee Complete 4 Views Left  Result Date: 11/24/2022 CLINICAL DATA:  Slipped on water at [employer] leading to fall. Pain in the left lower extremity. EXAM: LEFT KNEE - COMPLETE 4+ VIEW; LEFT FEMUR 2 VIEWS COMPARISON:  None Available. FINDINGS: Femur: Cortical margins of the femur are intact. No fracture. Hip alignment is maintained. No focal bone abnormality. Unremarkable soft tissues. Knee: No evidence of fracture, dislocation, or joint effusion. No evidence of arthropathy or other focal bone abnormality. Soft tissues are unremarkable. IMPRESSION: Negative radiographs of the left femur and knee. Electronically Signed   By: Narda Rutherford M.D.   On: 11/24/2022 18:39   DG FEMUR MIN 2 VIEWS LEFT  Result Date: 11/24/2022 CLINICAL DATA:  Slipped on water at [employer] leading to fall. Pain in the left lower extremity. EXAM: LEFT KNEE - COMPLETE 4+ VIEW; LEFT FEMUR 2 VIEWS COMPARISON:  None Available. FINDINGS: Femur: Cortical margins of the femur are intact. No fracture. Hip alignment is maintained. No focal bone abnormality. Unremarkable soft tissues. Knee: No evidence of fracture, dislocation, or joint effusion. No evidence of arthropathy or other focal bone abnormality. Soft tissues are unremarkable. IMPRESSION: Negative  radiographs of the left femur and knee. Electronically Signed   By: Narda Rutherford M.D.   On: 11/24/2022 18:39     Consults:  Case discussed with ***.   Reassessment and Plan:   ***   ***  Clinical Impression: No diagnosis found.   Data Unavailable   Final Clinical Impression(s) / ED Diagnoses Final diagnoses:  None     Rx / DC Orders ED Discharge Orders     None

## 2022-11-24 NOTE — ED Triage Notes (Signed)
Pt came in via POV d/t slipping on water at [employer] & falling into a split with both legs out to her side. Reports 6/10 pain in her Lt upper Leg & lower back. A/Ox4.

## 2022-11-24 NOTE — ED Provider Notes (Signed)
[place] EMERGENCY DEPARTMENT AT [facility] Provider Note   CSN: [id number] Arrival date & time: 11/24/22  1551     History Chief Complaint  Patient presents with   Back Pain   Leg Pain    HPI Stephanie [name] is a 37 y.o. female presenting after a fall at [employer]. She stretched her left leg into a split position felt she sprained a muscle. Then fell backwards onto her left side. Denies changes in sensation. Is able to ambulate.   Past Medical History:  Diagnosis Date   Anemia    No current facility-administered medications on file prior to encounter.   No current outpatient medications on file prior to encounter.    Allergies  Allergen Reactions   Pineapple Anaphylaxis   Latex Itching    Review of Systems   Review of Systems  Constitutional:  Negative for chills and fever.  Respiratory:  Negative for cough and shortness of breath.   Cardiovascular:  Negative for chest pain.  Gastrointestinal:  Negative for abdominal pain.  Musculoskeletal:  Positive for back pain. Negative for gait problem and joint swelling.  Neurological:  Negative for syncope, weakness and numbness.    Physical Exam Updated Vital Signs BP 120/85 (BP Location: Right Arm)   Pulse 70   Temp 98 F (36.7 C)   Resp 19   Ht 5\' 1"  (1.549 m)   Wt 90.7 kg   SpO2 100%   BMI 37.79 kg/m  Physical Exam Constitutional:      General: She is not in acute distress.    Appearance: Normal appearance. She is not ill-appearing.  Cardiovascular:     Rate and Rhythm: Normal rate and regular rhythm.     Heart sounds: No murmur heard.    No friction rub. No gallop.  Pulmonary:     Effort: No respiratory distress.     Breath sounds: No wheezing, rhonchi or rales.  Abdominal:     General: There is no distension.     Palpations: Abdomen is soft.     Tenderness: There is no abdominal tenderness. There is no guarding or rebound.  Musculoskeletal:        General: Tenderness present. No  swelling.     Right lower leg: No edema.     Left lower leg: No edema.  Skin:         Comments: Pulses present on both extremities. Warm on palpation. No limited range of motion, tenderness to palpation on the left thigh. Sensation intact on S1, L4 and L5 dermatomes. No open wounds or bruising noted.   Neurological:     Mental Status: She is alert.    ED Course/ Medical Decision Making/ A&P Clinical Course as of 11/25/22 0011  Tue Nov 25, 2022  0003 Stable fell at store. Leg pulled back [CC]    Clinical Course User Index [CC] Glyn Ade, MD    Medications Ordered in ED Medications  acetaminophen (TYLENOL) tablet 1,000 mg (has no administration in time range)  ibuprofen (ADVIL) tablet 400 mg (has no administration in time range)    Medical Decision Making:    Stephanie [name] is a 37 y.o. female who presented to the ED today after a fall.  Complete initial physical exam performed, notably the patient  was tender on the left upper calf, but had pulses present on both lower extremities had no neurological deficits.    Reviewed and confirmed nursing documentation for past medical history, family history, social  history.    Initial Assessment:   With the patient's presentation of a fall stretching her left thing most likely diagnosis is a muscle sprain 2/2 fall.  This is most consistent with an acute complicated illness  Initial Plan:  XR femur and knee  Radiology  All images reviewed independently. Agree with radiology report at this time.    DG Knee Complete 4 Views Left  Result Date: 11/24/2022 CLINICAL DATA:  Slipped on water at [employer] leading to fall. Pain in the left lower extremity. EXAM: LEFT KNEE - COMPLETE 4+ VIEW; LEFT FEMUR 2 VIEWS COMPARISON:  None Available. FINDINGS: Femur: Cortical margins of the femur are intact. No fracture. Hip alignment is maintained. No focal bone abnormality. Unremarkable soft tissues. Knee: No evidence of fracture,  dislocation, or joint effusion. No evidence of arthropathy or other focal bone abnormality. Soft tissues are unremarkable. IMPRESSION: Negative radiographs of the left femur and knee. Electronically Signed   By: Narda Rutherford M.D.   On: 11/24/2022 18:39   DG FEMUR MIN 2 VIEWS LEFT  Result Date: 11/24/2022 CLINICAL DATA:  Slipped on water at [employer] leading to fall. Pain in the left lower extremity. EXAM: LEFT KNEE - COMPLETE 4+ VIEW; LEFT FEMUR 2 VIEWS COMPARISON:  None Available. FINDINGS: Femur: Cortical margins of the femur are intact. No fracture. Hip alignment is maintained. No focal bone abnormality. Unremarkable soft tissues. Knee: No evidence of fracture, dislocation, or joint effusion. No evidence of arthropathy or other focal bone abnormality. Soft tissues are unremarkable. IMPRESSION: Negative radiographs of the left femur and knee. Electronically Signed   By: Narda Rutherford M.D.   On: 11/24/2022 18:39      Reassessment and Plan:    [name] at [facility] and stretched her left leg backwards. Felt she strained a muscle, then fell on her left side. Her back is also hurting. She is tender on palpation on the left lateral lower back. No signs of bruising. No pinpoint tenderness over the vertebrae. No signs of hip or femoral fracture. Will treat supportively.    Clinical Impression:  1. Fall, initial encounter      Discharge   Final Clinical Impression(s) / ED Diagnoses Final diagnoses:  Fall, initial encounter    Rx / DC Orders ED Discharge Orders          Ordered    oxyCODONE (ROXICODONE) 5 MG immediate release tablet  Every 6 hours PRN        11/25/22 0008    celecoxib (CELEBREX) 200 MG capsule  2 times daily        11/25/22 0008              [place], [place], MD 11/25/22 9604    Glyn Ade, MD 11/25/22 1603

## 2022-11-25 MED ORDER — OXYCODONE HCL 5 MG PO TABS: 5.0000 mg | ORAL_TABLET | Freq: Four times a day (QID) | ORAL | 0 refills | Status: AC | PRN

## 2022-11-25 MED ORDER — ACETAMINOPHEN 500 MG PO TABS
1000.0000 mg | ORAL_TABLET | Freq: Once | ORAL | Status: AC
  Administered 2022-11-25: 1000 mg via ORAL
  Filled 2022-11-25: qty 2

## 2022-11-25 MED ORDER — IBUPROFEN 400 MG PO TABS
400.0000 mg | ORAL_TABLET | Freq: Once | ORAL | Status: AC
  Administered 2022-11-25: 400 mg via ORAL
  Filled 2022-11-25: qty 1

## 2022-11-25 MED ORDER — CELECOXIB 200 MG PO CAPS: 200.0000 mg | ORAL_CAPSULE | Freq: Two times a day (BID) | ORAL | 0 refills | Status: AC

## 2022-11-27 ENCOUNTER — Ambulatory Visit: Payer: Commercial Managed Care - PPO | Admitting: Physician Assistant

## 2022-11-27 ENCOUNTER — Encounter: Payer: Self-pay | Admitting: Physician Assistant

## 2022-11-27 ENCOUNTER — Other Ambulatory Visit (INDEPENDENT_AMBULATORY_CARE_PROVIDER_SITE_OTHER): Payer: Commercial Managed Care - PPO

## 2022-11-27 DIAGNOSIS — M545 Low back pain, unspecified: Secondary | ICD-10-CM | POA: Diagnosis not present

## 2022-11-27 MED ORDER — METHOCARBAMOL 500 MG PO TABS: 500.0000 mg | ORAL_TABLET | Freq: Four times a day (QID) | ORAL | 0 refills | Status: AC | PRN

## 2022-11-27 MED ORDER — METHYLPREDNISOLONE 4 MG PO TBPK: ORAL_TABLET | ORAL | 0 refills | Status: AC

## 2022-11-27 NOTE — Addendum Note (Signed)
Addended by: Polly Cobia on: 11/27/2022 03:35 PM   Modules accepted: Orders

## 2022-11-27 NOTE — Progress Notes (Signed)
Office Visit Note   Patient: Stephanie [name]           Date of Birth: 01-Jun-1985           MRN: [id number] Visit Date: 11/27/2022              Requested by: No referring provider defined for this encounter. PCP: Patient, No Pcp Per   Assessment & Plan: Visit Diagnoses:  1. Acute midline low back pain, unspecified whether sciatica present     Plan: Marinell is a pleasant 37 year old woman who is 3 days status post slipping on some water at the grocery store.  She said she "went down on the splits ".  She was evaluated her knee and her femur in the emergency room no fractures.  She was given pain medication.  She does say that she feels like some of that is just sore but she is now complaining of low back pain denies any loss of bowel or bladder control denies any weakness or paresthesias.  She does have some tenderness in the low back but no step-offs that goes into the buttocks.  This is only 3 days ago so I am going to try her on a Medrol Dosepak and prescribe her some muscle relaxants.  Will follow-up with me in 2 weeks  Follow-Up Instructions: Return in about 2 weeks (around 12/11/2022).   Orders:  Orders Placed This Encounter  Procedures   XR Lumbar Spine 2-3 Views   Meds ordered this encounter  Medications   methylPREDNISolone (MEDROL DOSEPAK) 4 MG TBPK tablet    Sig: Take as directed with food    Dispense:  21 tablet    Refill:  0      Procedures: No procedures performed   Clinical Data: No additional findings.   Subjective: Chief Complaint  Patient presents with   Lower Back - Pain    HPI pleasant 37 year old woman who is 3 days status post slipping at a grocery store.  States she came down and "the splits "she initially had significant leg pain but was evaluated and found not to have any fractures.  She then noticed that she began to have the development of low back pain.  She has just been taking over the counter medication.  Does not really radiate except  a little bit into her posterior buttock  Review of Systems  All other systems reviewed and are negative.    Objective: Vital Signs: There were no vitals taken for this visit.  Physical Exam Constitutional:      Appearance: Normal appearance.  Pulmonary:     Effort: Pulmonary effort is normal.  Skin:    General: Skin is warm and dry.  Neurological:     General: No focal deficit present.     Mental Status: She is alert and oriented to person, place, and time.  Psychiatric:        Mood and Affect: Mood normal.     Ortho Exam Examination of her lower back she has no step-off she does have a significant lordosis.  She has some mild tenderness in the lower spine in the paravertebral muscles.  She has 5 out of 5 strength and sensation intact in the lower extremities compartments are soft and compressible negative Denna Haggard' sign Specialty Comments:  No specialty comments available.  Imaging: No results found.   PMFS History: Patient Active Problem List   Diagnosis Date Noted   Low back pain 11/27/2022   SVD (spontaneous vaginal delivery)  03/04/2016   Group beta Strep positive 03/04/2016   Obstetric labial laceration, delivered, current hospitalization 03/04/2016   Term pregnancy 03/03/2016   Non-reassuring electronic fetal monitoring tracing 03/02/2016   Past Medical History:  Diagnosis Date   Anemia     Family History  Problem Relation Age of Onset   Cancer Maternal Aunt    Diabetes Maternal Grandmother    Hypertension Maternal Grandmother    Cancer Paternal Grandmother     Past Surgical History:  Procedure Laterality Date   NO PAST SURGERIES     Social History   Occupational History   Not on file  Tobacco Use   Smoking status: Never   Smokeless tobacco: Never  Substance and Sexual Activity   Alcohol use: No   Drug use: No   Sexual activity: Yes    Birth control/protection: None

## 2022-12-10 ENCOUNTER — Ambulatory Visit: Payer: Commercial Managed Care - PPO | Admitting: Physician Assistant

## 2022-12-10 ENCOUNTER — Encounter: Payer: Self-pay | Admitting: Physician Assistant

## 2022-12-10 DIAGNOSIS — M545 Low back pain, unspecified: Secondary | ICD-10-CM | POA: Diagnosis not present

## 2022-12-10 MED ORDER — MELOXICAM 15 MG PO TABS: 15.0000 mg | ORAL_TABLET | Freq: Every day | ORAL | 0 refills | Status: AC

## 2022-12-10 NOTE — Progress Notes (Signed)
Office Visit Note   Patient: Stephanie [name]           Date of Birth: February 17, 1986           MRN: [id number] Visit Date: 12/10/2022              Requested by: No referring provider defined for this encounter. PCP: Patient, No Pcp Per  No chief complaint on file.     HPI: Patient presents today for follow-up on her low back pain.  She is now 2 weeks status post slipped in some water in a store.  She was evaluated in the emergency room but also developed some focal low back pain.  She did try a steroid Dosepak did not think it helped her all that much.  She denies any radicular symptoms.  Assessment & Plan: Visit Diagnoses: Low back pain  Plan: This point would like her to try a course of physical therapy.  If she does not improve I do think an MRI would be appropriate.  She is neurovascularly intact and does not have any radicular symptoms at this time  Follow-Up Instructions: No follow-ups on file.   Ortho Exam  Patient is alert, oriented, no adenopathy, well-dressed, normal affect, normal respiratory effort. Examination of her low back she is neurovascular intact she has some tenderness palpation in the low back and the paravertebral muscles but no increased with flexion or extension of her back strength is intact sensation is intact  Imaging: No results found. No images are attached to the encounter.  Labs: Lab Results  Component Value Date   REPTSTATUS 01/09/2020 FINAL 01/06/2020   CULT MULTIPLE SPECIES PRESENT, SUGGEST RECOLLECTION (A) 01/06/2020   LABORGA ESCHERICHIA COLI 07/13/2013     Lab Results  Component Value Date   ALBUMIN 4.0 11/09/2017   ALBUMIN 3.9 06/12/2015   ALBUMIN 4.2 05/13/2015    No results found for: "MG" No results found for: "VD25OH"  No results found for: "PREALBUMIN"    Latest Ref Rng & Units 11/09/2017    5:40 PM 03/04/2016    5:23 AM 03/03/2016    9:00 PM  CBC EXTENDED  WBC 4.0 - 10.5 K/uL 5.3  9.8  6.7   RBC 3.87 - 5.11 MIL/uL  4.13  3.48  3.80   Hemoglobin 12.0 - 15.0 g/dL 40.9  81.1  91.4   HCT 36.0 - 46.0 % 35.9  29.0  32.2   Platelets 150 - 400 K/uL 226  138  189      There is no height or weight on file to calculate BMI.  Orders:  No orders of the defined types were placed in this encounter.  No orders of the defined types were placed in this encounter.    Procedures: No procedures performed  Clinical Data: No additional findings.  ROS:  All other systems negative, except as noted in the HPI. Review of Systems  Objective: Vital Signs: There were no vitals taken for this visit.  Specialty Comments:  No specialty comments available.  PMFS History: Patient Active Problem List   Diagnosis Date Noted   Low back pain 11/27/2022   SVD (spontaneous vaginal delivery) 03/04/2016   Group beta Strep positive 03/04/2016   Obstetric labial laceration, delivered, current hospitalization 03/04/2016   Term pregnancy 03/03/2016   Non-reassuring electronic fetal monitoring tracing 03/02/2016   Past Medical History:  Diagnosis Date   Anemia     Family History  Problem Relation Age of Onset   Cancer  Maternal Aunt    Diabetes Maternal Grandmother    Hypertension Maternal Grandmother    Cancer Paternal Grandmother     Past Surgical History:  Procedure Laterality Date   NO PAST SURGERIES     Social History   Occupational History   Not on file  Tobacco Use   Smoking status: Never   Smokeless tobacco: Never  Substance and Sexual Activity   Alcohol use: No   Drug use: No   Sexual activity: Yes    Birth control/protection: None

## 2022-12-11 ENCOUNTER — Ambulatory Visit: Payer: Commercial Managed Care - PPO | Admitting: Physician Assistant

## 2022-12-17 NOTE — Therapy (Signed)
OUTPATIENT PHYSICAL THERAPY EVALUATION   Patient Name: Stephanie [name] MRN: [id number] DOB:25-Jun-1985, 37 y.o., female Today's Date: 12/18/2022  END OF SESSION:  PT End of Session - 12/18/22 0831     Visit Number 1    Number of Visits 20    Date for PT Re-Evaluation 02/26/23    Authorization Type [employer] UMR 30 PT visits no copay    PT Start Time 0840    PT Stop Time 0910    PT Time Calculation (min) 30 min    Activity Tolerance Patient tolerated treatment well    Behavior During Therapy [facility] for tasks assessed/performed             Past Medical History:  Diagnosis Date   Anemia    Past Surgical History:  Procedure Laterality Date   NO PAST SURGERIES     Patient Active Problem List   Diagnosis Date Noted   Low back pain 11/27/2022   SVD (spontaneous vaginal delivery) 03/04/2016   Group beta Strep positive 03/04/2016   Obstetric labial laceration, delivered, current hospitalization 03/04/2016   Term pregnancy 03/03/2016   Non-reassuring electronic fetal monitoring tracing 03/02/2016    PCP: None listed in epic   REFERRING PROVIDER: Persons, [place], [place]  REFERRING DIAG: M54.50 (ICD-10-CM) - Acute midline low back pain, unspecified whether sciatica present  Rationale for Evaluation and Treatment: Rehabilitation  THERAPY DIAG:  Other low back pain  Muscle weakness (generalized)  Difficulty in walking, not elsewhere classified  ONSET DATE: Sept 23, 2024  SUBJECTIVE:                                                                                                                                                                                           SUBJECTIVE STATEMENT: Reported slip in water at store, seen by ED with continued back pain noted.  Had Dosepak without substantial help per MD note.   Pt indicated symptoms about the same since onset.  Taking OTC medicine.  Reported consistent symptoms during the day but sometimes worse. Reported difficulty  sleeping.    Reported no numbness/tingling in legs.    PERTINENT HISTORY:  History of anemia  PAIN:  NPRS scale: current 6/10, at worst 8/10 Pain location: back  Pain description: constant, sharp at times.  Aggravating factors: prolonged standing/walking.   Relieving factors: medicine  PRECAUTIONS: None  WEIGHT BEARING RESTRICTIONS: No  FALLS:  Has patient fallen in last 6 months? Yes 1  LIVING ENVIRONMENT:  Lives in: House/apartment  Steps required at work.  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  PLOF: Independent, has yard work.    PATIENT GOALS: Reduce pain  OBJECTIVE:   PATIENT SURVEYS:  12/18/2022 FOTO eval:  56  predicted:  70  SCREENING FOR RED FLAGS: 12/18/2022 Bowel or bladder incontinence: No Cauda equina syndrome: No  COGNITION: 12/18/2022 Overall cognitive status: WFL normal      SENSATION: 12/18/2022 WFL  MUSCLE LENGTH: 12/18/2022  POSTURE:  12/18/2022 Mild increase in lumbar lordosis  PALPATION: 12/18/2022 No specific testing today  LUMBAR ROM:   12/18/2022:  no centralization/peripheralization noted in lumbar movements.   AROM 12/18/2022  Flexion To mid shin with painful arc up return  Extension 100% c ERP  Repeated x 3 in standing:  continued ERP noted   Right lateral flexion To head of fibula no complaints  Left lateral flexion To head of fibula no complaints  Right rotation   Left rotation    (Blank rows = not tested)  LOWER EXTREMITY ROM:      Right 12/18/2022 Left 12/18/2022  Hip flexion    Hip extension    Hip abduction    Hip adduction    Hip internal rotation    Hip external rotation    Knee flexion    Knee extension    Ankle dorsiflexion    Ankle plantarflexion    Ankle inversion    Ankle eversion     (Blank rows = not tested)  LOWER EXTREMITY MMT:    MMT Right 12/18/2022 Left 12/18/2022  Hip flexion 5/5 4+/5  Hip extension    Hip abduction     Hip adduction    Hip internal rotation    Hip external rotation    Knee flexion 5/5 4/5  Knee extension 5/5 4/5  Ankle dorsiflexion 5/5 5/5  Ankle plantarflexion        Lumbar flexion supine bilateral SLR  3/5   (Blank rows = not tested)  LUMBAR SPECIAL TESTS:  12/18/2022 Slump test: Negative bilaterally   FUNCTIONAL TESTS:  12/18/2022 None tested   GAIT: 12/18/2022 Independent ambulation                                                                                                                                                                        TODAY'S TREATMENT:  DATE: 12/18/2022  Therex:    HEP instruction/performance c cues for techniques, handout provided.  Trial set performed of each for comprehension and symptom assessment.  See below for exercise list  PATIENT EDUCATION:  12/18/2022 Education details: HEP, POC Person educated: Patient Education method: Explanation, Demonstration, Verbal cues, and Handouts Education comprehension: verbalized understanding, returned demonstration, and verbal cues required  HOME EXERCISE PROGRAM: Access Code: [email or web address] URL: [email or web address] Date: 12/18/2022 Prepared by: Chyrel Masson  Exercises - Supine Lower Trunk Rotation  - 2-3 x daily - 7 x weekly - 1 sets - 3-5 reps - 15 hold - Supine Bridge  - 1-2 x daily - 7 x weekly - 1-2 sets - 10 reps - 2 hold - Seated Multifidi Isometric  - 2-3 x daily - 7 x weekly - 1 sets - 10 reps - 5-10 hold - Seated Abdominal Press into [employer]  - 2-3 x daily - 7 x weekly - 1 sets - 10 reps - 5-10 hold - Seated Scapular Retraction  - 2-3 x daily - 7 x weekly - 1 sets - 10 reps - 3-5 hold  ASSESSMENT:  CLINICAL IMPRESSION: Patient is a 37 y.o. who comes to clinic with complaints of back pain with mobility, strength and movement coordination deficits that impair  their ability to perform usual daily and recreational functional activities without increase difficulty/symptoms at this time.  Patient to benefit from skilled PT services to address impairments and limitations to improve to previous level of function without restriction secondary to condition.   OBJECTIVE IMPAIRMENTS: decreased activity tolerance, decreased coordination, decreased endurance, decreased mobility, difficulty walking, decreased ROM, decreased strength, increased fascial restrictions, impaired perceived functional ability, increased muscle spasms, impaired flexibility, improper body mechanics, and pain.   ACTIVITY LIMITATIONS: carrying, lifting, bending, standing, squatting, sleeping, stairs, transfers, and locomotion level  PARTICIPATION LIMITATIONS: meal prep, cleaning, laundry, interpersonal relationship, shopping, community activity, occupation, and yard work  PERSONAL FACTORS:  no specific factors   are also affecting patient's functional outcome.   REHAB POTENTIAL: Good  CLINICAL DECISION MAKING: Stable/uncomplicated  EVALUATION COMPLEXITY: Low   GOALS: Goals reviewed with patient? Yes  SHORT TERM GOALS: (target date for Short term goals are 3 weeks 01/08/2023)  1. Patient will demonstrate independent use of home exercise program to maintain progress from in clinic treatments.  Goal status: New  LONG TERM GOALS: (target dates for all long term goals are 10 weeks  02/26/2023 )   1. Patient will demonstrate/report pain at worst less than or equal to 2/10 to facilitate minimal limitation in daily activity secondary to pain symptoms.  Goal status: New   2. Patient will demonstrate independent use of home exercise program to facilitate ability to maintain/progress functional gains from skilled physical therapy services.  Goal status: New   3. Patient will demonstrate FOTO outcome > or = 70 % to indicate reduced disability due to condition.  Goal status: New   4.  Patient will demonstrate lumbar extension 100 % WFL s symptoms to facilitate upright standing, walking posture at PLOF s limitation.  Goal status: New   5.  Patient will demonstrate/report ability to ascend/descend stairs reciprocally for work environments symptoms/limitations.   Goal status: New   6.  Patient will demonstrate Lt MMT equal to Rt, lumbar flexion > or = 4/5 to facilitate usual daily movement at PLOF.  Goal status: New    PLAN:  PT FREQUENCY: 1-2x/week  PT DURATION: 10 weeks  PLANNED INTERVENTIONS: Can include 62952- PT Re-evaluation,  97110-Therapeutic exercises, 97530- Therapeutic activity, [id number]- Neuromuscular re-education, [phone]- Self Care, 32440- Manual therapy, [phone]- Gait training, [phone]- Orthotic Fit/training, [phone]- Canalith repositioning, [id number]- Aquatic Therapy, 97014- Electrical stimulation (unattended), [id number]- Electrical stimulation (manual), [id number]- Vasopneumatic device, [id number]- Ultrasound, [id number]- Traction (mechanical), [id number]- Ionotophoresis 4mg /ml Dexamethasone, Patient/Family education, Balance training, Stair training, Taping, Dry Needling, Joint mobilization, Joint manipulation, Spinal manipulation, Spinal mobilization, Scar mobilization, Vestibular training, Visual/preceptual remediation/compensation, DME instructions, Cryotherapy, and Moist heat.  All performed as medically necessary.  All included unless contraindicated  PLAN FOR NEXT SESSION: Review HEP knowledge/results.   Recheck general lumbar mobility response.  Improve functional LE strength for stairs/squats, core stabilizations.    Chyrel Masson, PT, DPT, OCS, ATC 12/18/22  9:17 AM

## 2022-12-18 ENCOUNTER — Ambulatory Visit (INDEPENDENT_AMBULATORY_CARE_PROVIDER_SITE_OTHER): Payer: Commercial Managed Care - PPO | Admitting: Rehabilitative and Restorative Service Providers"

## 2022-12-18 ENCOUNTER — Encounter: Payer: Self-pay | Admitting: Rehabilitative and Restorative Service Providers"

## 2022-12-18 ENCOUNTER — Other Ambulatory Visit: Payer: Self-pay

## 2022-12-18 DIAGNOSIS — M5459 Other low back pain: Secondary | ICD-10-CM

## 2022-12-18 DIAGNOSIS — R262 Difficulty in walking, not elsewhere classified: Secondary | ICD-10-CM | POA: Diagnosis not present

## 2022-12-18 DIAGNOSIS — M6281 Muscle weakness (generalized): Secondary | ICD-10-CM

## 2022-12-23 ENCOUNTER — Ambulatory Visit (INDEPENDENT_AMBULATORY_CARE_PROVIDER_SITE_OTHER): Payer: Commercial Managed Care - PPO | Admitting: Physical Therapy

## 2022-12-23 ENCOUNTER — Encounter: Payer: Self-pay | Admitting: Physical Therapy

## 2022-12-23 DIAGNOSIS — M5459 Other low back pain: Secondary | ICD-10-CM | POA: Diagnosis not present

## 2022-12-23 DIAGNOSIS — M6281 Muscle weakness (generalized): Secondary | ICD-10-CM | POA: Diagnosis not present

## 2022-12-23 DIAGNOSIS — R262 Difficulty in walking, not elsewhere classified: Secondary | ICD-10-CM

## 2022-12-23 NOTE — Therapy (Signed)
OUTPATIENT PHYSICAL THERAPY    Patient Name: XIYA MAEZ MRN: [id number] DOB:Sep 17, 1985, 37 y.o., female Today's Date: 12/23/2022  END OF SESSION:  PT End of Session - 12/23/22 0815     Visit Number 2    Number of Visits 20    Date for PT Re-Evaluation 02/26/23    Authorization Type [employer] UMR 30 PT visits no copay    PT Start Time 0816   late arrival   PT Stop Time 0845    PT Time Calculation (min) 29 min    Activity Tolerance Patient tolerated treatment well    Behavior During Therapy [facility] for tasks assessed/performed              Past Medical History:  Diagnosis Date   Anemia    Past Surgical History:  Procedure Laterality Date   NO PAST SURGERIES     Patient Active Problem List   Diagnosis Date Noted   Low back pain 11/27/2022   SVD (spontaneous vaginal delivery) 03/04/2016   Group beta Strep positive 03/04/2016   Obstetric labial laceration, delivered, current hospitalization 03/04/2016   Term pregnancy 03/03/2016   Non-reassuring electronic fetal monitoring tracing 03/02/2016    PCP: None listed in epic   REFERRING PROVIDER: Persons, [place], [place]  REFERRING DIAG: M54.50 (ICD-10-CM) - Acute midline low back pain, unspecified whether sciatica present  Rationale for Evaluation and Treatment: Rehabilitation  THERAPY DIAG:  Other low back pain  Muscle weakness (generalized)  Difficulty in walking, not elsewhere classified  ONSET DATE: Sept 23, 2024  SUBJECTIVE:                                                                                                                                                                                           SUBJECTIVE STATEMENT: Pt states she can tell the exercises are doing something. Has been able to do them at home.  PERTINENT HISTORY:  History of anemia  PAIN:  NPRS scale: current 6/10, at worst 8/10 Pain location: back  Pain description: constant, sharp at times.  Aggravating factors: prolonged  standing/walking.   Relieving factors: medicine  PRECAUTIONS: None  WEIGHT BEARING RESTRICTIONS: No  FALLS:  Has patient fallen in last 6 months? Yes 1  LIVING ENVIRONMENT:  Lives in: House/apartment  Steps required at work.  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   PLOF: Independent, has yard work.    PATIENT GOALS: Reduce pain  OBJECTIVE:   PATIENT SURVEYS:  12/18/2022 FOTO eval:  56  predicted:  70  SCREENING FOR RED FLAGS: 12/18/2022 Bowel or bladder  incontinence: No Cauda equina syndrome: No  COGNITION: 12/18/2022 Overall cognitive status: WFL normal     POSTURE:  12/18/2022 Mild increase in lumbar lordosis  PALPATION: 12/18/2022 No specific testing today  LUMBAR ROM:  12/18/2022:  no centralization/peripheralization noted in lumbar movements.   AROM 12/18/2022  Flexion To mid shin with painful arc up return  Extension 100% c ERP  Repeated x 3 in standing:  continued ERP noted   Right lateral flexion To head of fibula no complaints  Left lateral flexion To head of fibula no complaints  Right rotation   Left rotation    (Blank rows = not tested)  LOWER EXTREMITY ROM:      Right 12/18/2022 Left 12/18/2022  Hip flexion    Hip extension    Hip abduction    Hip adduction    Hip internal rotation    Hip external rotation    Knee flexion    Knee extension    Ankle dorsiflexion    Ankle plantarflexion    Ankle inversion    Ankle eversion     (Blank rows = not tested)  LOWER EXTREMITY MMT:    MMT Right 12/18/2022 Left 12/18/2022  Hip flexion 5/5 4+/5  Hip extension    Hip abduction    Hip adduction    Hip internal rotation    Hip external rotation    Knee flexion 5/5 4/5  Knee extension 5/5 4/5  Ankle dorsiflexion 5/5 5/5  Ankle plantarflexion        Lumbar flexion supine bilateral SLR  3/5   (Blank rows = not tested)  LUMBAR SPECIAL TESTS:  12/18/2022 Slump test: Negative  bilaterally   FUNCTIONAL TESTS:  12/18/2022 None tested   GAIT: 12/18/2022 Independent ambulation                                                                                                                                                                       [facility] Adult PT Treatment:                                                DATE: 12/23/22 Therapeutic Exercise: Supine  LTR 5x10 sec DKTC 2x30 sec Piriformis stretch 2x 30 sec R&L PPT 10x3 sec Bridge 2x10x3 sec Swiss ball press down 10x5 sec Swiss ball press down (alternate arm & leg presses) 10x5 sec Quadruped Child's pose x 30 sec Bird dog 2x10 Sitting Multifidi iso   TODAY'S TREATMENT:  DATE: 12/18/2022  Therex:    HEP instruction/performance c cues for techniques, handout provided.  Trial set performed of each for comprehension and symptom assessment.  See below for exercise list  PATIENT EDUCATION:  12/18/2022 Education details: HEP, POC Person educated: Patient Education method: Explanation, Demonstration, Verbal cues, and Handouts Education comprehension: verbalized understanding, returned demonstration, and verbal cues required  HOME EXERCISE PROGRAM: Access Code: [email or web address] URL: [email or web address] Date: 12/18/2022 Prepared by: Chyrel Masson  Exercises - Supine Lower Trunk Rotation  - 2-3 x daily - 7 x weekly - 1 sets - 3-5 reps - 15 hold - Supine Bridge  - 1-2 x daily - 7 x weekly - 1-2 sets - 10 reps - 2 hold - Seated Multifidi Isometric  - 2-3 x daily - 7 x weekly - 1 sets - 10 reps - 5-10 hold - Seated Abdominal Press into [employer]  - 2-3 x daily - 7 x weekly - 1 sets - 10 reps - 5-10 hold - Seated Scapular Retraction  - 2-3 x daily - 7 x weekly - 1 sets - 10 reps - 3-5 hold  ASSESSMENT:  CLINICAL IMPRESSION: Treatment focused on reviewing HEP. Initiated gentle hip stretches.  Continued to work on hip and core strengthening. Pt very challenged with bird dogs. Pt tolerated treatment well with no adverse effects.   From eval: Patient is a 37 y.o. who comes to clinic with complaints of back pain with mobility, strength and movement coordination deficits that impair their ability to perform usual daily and recreational functional activities without increase difficulty/symptoms at this time.  Patient to benefit from skilled PT services to address impairments and limitations to improve to previous level of function without restriction secondary to condition.    GOALS: Goals reviewed with patient? Yes  SHORT TERM GOALS: (target date for Short term goals are 3 weeks 01/08/2023)  1. Patient will demonstrate independent use of home exercise program to maintain progress from in clinic treatments.  Goal status: New  LONG TERM GOALS: (target dates for all long term goals are 10 weeks  02/26/2023 )   1. Patient will demonstrate/report pain at worst less than or equal to 2/10 to facilitate minimal limitation in daily activity secondary to pain symptoms.  Goal status: New   2. Patient will demonstrate independent use of home exercise program to facilitate ability to maintain/progress functional gains from skilled physical therapy services.  Goal status: New   3. Patient will demonstrate FOTO outcome > or = 70 % to indicate reduced disability due to condition.  Goal status: New   4. Patient will demonstrate lumbar extension 100 % WFL s symptoms to facilitate upright standing, walking posture at PLOF s limitation.  Goal status: New   5.  Patient will demonstrate/report ability to ascend/descend stairs reciprocally for work environments symptoms/limitations.   Goal status: New   6.  Patient will demonstrate Lt MMT equal to Rt, lumbar flexion > or = 4/5 to facilitate usual daily movement at PLOF.  Goal status: New    PLAN:  PT FREQUENCY: 1-2x/week  PT DURATION: 10  weeks  PLANNED INTERVENTIONS: Can include 72536- PT Re-evaluation, 97110-Therapeutic exercises, 97530- Therapeutic activity, [id number]- Neuromuscular re-education, 97535- Self Care, 97140- Manual therapy, [phone]- Gait training, [phone]- Orthotic Fit/training, [phone]- Canalith repositioning, [id number]- Aquatic Therapy, 97014- Electrical stimulation (unattended), [id number]- Electrical stimulation (manual), [id number]- Vasopneumatic device, [id number]- Ultrasound, [id number]- Traction (mechanical), [id number]- Ionotophoresis 4mg /ml Dexamethasone, Patient/Family education, Balance training, Stair training, Taping, Dry Needling, Joint mobilization, Joint manipulation, Spinal  manipulation, Spinal mobilization, Scar mobilization, Vestibular training, Visual/preceptual remediation/compensation, DME instructions, Cryotherapy, and Moist heat.  All performed as medically necessary.  All included unless contraindicated  PLAN FOR NEXT SESSION: Review HEP knowledge/results.   Recheck general lumbar mobility response.  Improve functional LE strength for stairs/squats, core stabilizations.   Houston Zapien April Ma L Katurah Karapetian, PT 12/23/22  8:16 AM

## 2022-12-31 ENCOUNTER — Encounter: Payer: Self-pay | Admitting: Physical Therapy

## 2022-12-31 ENCOUNTER — Ambulatory Visit (INDEPENDENT_AMBULATORY_CARE_PROVIDER_SITE_OTHER): Payer: Commercial Managed Care - PPO | Admitting: Physical Therapy

## 2022-12-31 DIAGNOSIS — R262 Difficulty in walking, not elsewhere classified: Secondary | ICD-10-CM

## 2022-12-31 DIAGNOSIS — M6281 Muscle weakness (generalized): Secondary | ICD-10-CM | POA: Diagnosis not present

## 2022-12-31 DIAGNOSIS — M5459 Other low back pain: Secondary | ICD-10-CM

## 2022-12-31 NOTE — Therapy (Signed)
OUTPATIENT PHYSICAL THERAPY TREATMENT   Patient Name: Stephanie [name] MRN: [id number] DOB:June 19, 1985, 37 y.o., female Today's Date: 12/31/2022  END OF SESSION:  PT End of Session - 12/31/22 0851     Visit Number 3    Number of Visits 20    Date for PT Re-Evaluation 02/26/23    Authorization Type [employer] UMR 30 PT visits no copay    PT Start Time 0812    PT Stop Time 0845    PT Time Calculation (min) 33 min    Activity Tolerance Patient tolerated treatment well    Behavior During Therapy [facility] for tasks assessed/performed               Past Medical History:  Diagnosis Date   Anemia    Past Surgical History:  Procedure Laterality Date   NO PAST SURGERIES     Patient Active Problem List   Diagnosis Date Noted   Low back pain 11/27/2022   SVD (spontaneous vaginal delivery) 03/04/2016   Group beta Strep positive 03/04/2016   Obstetric labial laceration, delivered, current hospitalization 03/04/2016   Term pregnancy 03/03/2016   Non-reassuring electronic fetal monitoring tracing 03/02/2016    PCP: None listed in epic   REFERRING PROVIDER: Persons, [place], [place]  REFERRING DIAG: M54.50 (ICD-10-CM) - Acute midline low back pain, unspecified whether sciatica present  Rationale for Evaluation and Treatment: Rehabilitation  THERAPY DIAG:  Other low back pain  Muscle weakness (generalized)  Difficulty in walking, not elsewhere classified  ONSET DATE: Sept 23, 2024  SUBJECTIVE:                                                                                                                                                                                           SUBJECTIVE STATEMENT: Pt reporting 5/10 pain in low back.   PERTINENT HISTORY:  History of anemia  PAIN:  NPRS scale: current 5/10, at worst 8/10 Pain location: back  Pain description: constant, sharp at times.  Aggravating factors: prolonged standing/walking.   Relieving factors:  medicine  PRECAUTIONS: None  WEIGHT BEARING RESTRICTIONS: No  FALLS:  Has patient fallen in last 6 months? Yes 1  LIVING ENVIRONMENT:  Lives in: House/apartment  Steps required at work.  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   PLOF: Independent, has yard work.    PATIENT GOALS: Reduce pain  OBJECTIVE:   PATIENT SURVEYS:  12/18/2022 FOTO eval:  56  predicted:  70  SCREENING FOR RED FLAGS: 12/18/2022 Bowel or bladder incontinence: No Cauda equina syndrome: No  COGNITION: 12/18/2022 Overall cognitive status:  WFL normal     POSTURE:  12/18/2022 Mild increase in lumbar lordosis  PALPATION: 12/18/2022 No specific testing today  LUMBAR ROM:  12/18/2022:  no centralization/peripheralization noted in lumbar movements.   AROM 12/18/2022  Flexion To mid shin with painful arc up return  Extension 100% c ERP  Repeated x 3 in standing:  continued ERP noted   Right lateral flexion To head of fibula no complaints  Left lateral flexion To head of fibula no complaints  Right rotation   Left rotation    (Blank rows = not tested)  LOWER EXTREMITY ROM:      Right 12/18/2022 Left 12/18/2022  Hip flexion    Hip extension    Hip abduction    Hip adduction    Hip internal rotation    Hip external rotation    Knee flexion    Knee extension    Ankle dorsiflexion    Ankle plantarflexion    Ankle inversion    Ankle eversion     (Blank rows = not tested)  LOWER EXTREMITY MMT:    MMT Right 12/18/2022 Left 12/18/2022  Hip flexion 5/5 4+/5  Hip extension    Hip abduction    Hip adduction    Hip internal rotation    Hip external rotation    Knee flexion 5/5 4/5  Knee extension 5/5 4/5  Ankle dorsiflexion 5/5 5/5  Ankle plantarflexion        Lumbar flexion supine bilateral SLR  3/5   (Blank rows = not tested)  LUMBAR SPECIAL TESTS:  12/18/2022 Slump test: Negative bilaterally   FUNCTIONAL TESTS:   12/18/2022 None tested   GAIT: 12/18/2022 Independent ambulation                                                                                                                                                                       TODAY'S TREATMENT:  12/31/22:  TherEx:  Nustep: Leve 5 x 5 minutes, UE/LE Supine bridges c PPT holding 5 sec Supine bridge c feet on red physio-ball x 10 holding 5 sec Supine trunk rotation: x 3 bil sides holindg 30 sec Supine dead bug: 2 x 10  Piriformis stretch: x 3 bil holding 20 sec glute stretch:  x 2 bil holding 20 sec Cat camel: x 5 holding 10 sec Prone press ups: x 3 holding 10 sec Manual:  Lumbar mobs: PA grade 2-3 L2-L4 Percussion to lumbar paraspinals     [facility] Adult PT Treatment:  DATE: 12/23/22 Therapeutic Exercise: Supine  LTR 5x10 sec DKTC 2x30 sec Piriformis stretch 2x 30 sec R&L PPT 10x3 sec Bridge 2x10x3 sec Swiss ball press down 10x5 sec Swiss ball press down (alternate arm & leg presses) 10x5 sec Quadruped Child's pose x 30 sec Bird dog 2x10 Sitting Multifidi iso   TODAY'S TREATMENT:                                                                                                         DATE: 12/18/2022  Therex:    HEP instruction/performance c cues for techniques, handout provided.  Trial set performed of each for comprehension and symptom assessment.  See below for exercise list  PATIENT EDUCATION:  12/18/2022 Education details: HEP, POC Person educated: Patient Education method: Explanation, Demonstration, Verbal cues, and Handouts Education comprehension: verbalized understanding, returned demonstration, and verbal cues required  HOME EXERCISE PROGRAM: Access Code: [email or web address] URL: [email or web address] Date: 12/18/2022 Prepared by: Chyrel Masson  Exercises - Supine Lower Trunk Rotation  - 2-3 x daily - 7 x weekly - 1 sets - 3-5 reps - 15 hold -  Supine Bridge  - 1-2 x daily - 7 x weekly - 1-2 sets - 10 reps - 2 hold - Seated Multifidi Isometric  - 2-3 x daily - 7 x weekly - 1 sets - 10 reps - 5-10 hold - Seated Abdominal Press into [employer]  - 2-3 x daily - 7 x weekly - 1 sets - 10 reps - 5-10 hold - Seated Scapular Retraction  - 2-3 x daily - 7 x weekly - 1 sets - 10 reps - 3-5 hold  ASSESSMENT:  CLINICAL IMPRESSION: 12/31/2022: Pt arriving to therapy reporting 5/10 low back pain. Pt stating her HEP is going well. Pt tolerating all exercises well with good response to manual therapy this visit. Continue skilled PT interventions.    From eval: Patient is a 36 y.o. who comes to clinic with complaints of back pain with mobility, strength and movement coordination deficits that impair their ability to perform usual daily and recreational functional activities without increase difficulty/symptoms at this time.  Patient to benefit from skilled PT services to address impairments and limitations to improve to previous level of function without restriction secondary to condition.    GOALS: Goals reviewed with patient? Yes  SHORT TERM GOALS: (target date for Short term goals are 3 weeks 01/08/2023)  1. Patient will demonstrate independent use of home exercise program to maintain progress from in clinic treatments.  Goal status: On-going 12/31/22  LONG TERM GOALS: (target dates for all long term goals are 10 weeks  02/26/2023 )   1. Patient will demonstrate/report pain at worst less than or equal to 2/10 to facilitate minimal limitation in daily activity secondary to pain symptoms.  Goal status: New   2. Patient will demonstrate independent use of home exercise program to facilitate ability to maintain/progress functional gains from skilled physical therapy services.  Goal status: New   3. Patient will demonstrate FOTO outcome > or = 70 % to indicate reduced disability  due to condition.  Goal status: New   4. Patient will  demonstrate lumbar extension 100 % WFL s symptoms to facilitate upright standing, walking posture at PLOF s limitation.  Goal status: New   5.  Patient will demonstrate/report ability to ascend/descend stairs reciprocally for work environments symptoms/limitations.   Goal status: New   6.  Patient will demonstrate Lt MMT equal to Rt, lumbar flexion > or = 4/5 to facilitate usual daily movement at PLOF.  Goal status: New    PLAN:  PT FREQUENCY: 1-2x/week  PT DURATION: 10 weeks  PLANNED INTERVENTIONS: Can include 09811- PT Re-evaluation, 97110-Therapeutic exercises, 97530- Therapeutic activity, [id number]- Neuromuscular re-education, 97535- Self Care, 97140- Manual therapy, [phone]- Gait training, [phone]- Orthotic Fit/training, [phone]- Canalith repositioning, [id number]- Aquatic Therapy, 97014- Electrical stimulation (unattended), [id number]- Electrical stimulation (manual), [id number]- Vasopneumatic device, [id number]- Ultrasound, [id number]- Traction (mechanical), [id number]- Ionotophoresis 4mg /ml Dexamethasone, Patient/Family education, Balance training, Stair training, Taping, Dry Needling, Joint mobilization, Joint manipulation, Spinal manipulation, Spinal mobilization, Scar mobilization, Vestibular training, Visual/preceptual remediation/compensation, DME instructions, Cryotherapy, and Moist heat.  All performed as medically necessary.  All included unless contraindicated  PLAN FOR NEXT SESSION: Review HEP knowledge/results.   Recheck general lumbar mobility response.  Improve functional LE strength for stairs/squats, core stabilizations.   Sharmon Leyden, PT, MPT 12/31/22  8:52 AM

## 2023-01-07 ENCOUNTER — Encounter: Payer: Commercial Managed Care - PPO | Admitting: Physical Therapy

## 2023-01-13 ENCOUNTER — Telehealth: Payer: Self-pay | Admitting: Physical Therapy

## 2023-01-13 ENCOUNTER — Encounter: Payer: Commercial Managed Care - PPO | Admitting: Physical Therapy

## 2023-01-13 NOTE — Telephone Encounter (Signed)
I called pt to follow up after she missed her 8:00 am appointment on 01/13/23. Pt stating she did not receive a reminder and was sorry she missed her appointment. Pt was offered an opening later this week on Friday but states she will just continue to work on her HEP and she will be at her next appointment on 01/19/23 at 8:00 am.   Narda Amber, PT, MPT 01/13/23 8:20 AM

## 2023-01-19 ENCOUNTER — Telehealth: Payer: Self-pay | Admitting: Rehabilitative and Restorative Service Providers"

## 2023-01-19 ENCOUNTER — Encounter: Payer: Self-pay | Admitting: Physical Therapy

## 2023-01-19 ENCOUNTER — Encounter: Payer: Commercial Managed Care - PPO | Admitting: Rehabilitative and Restorative Service Providers"

## 2023-01-19 ENCOUNTER — Ambulatory Visit (INDEPENDENT_AMBULATORY_CARE_PROVIDER_SITE_OTHER): Payer: Commercial Managed Care - PPO | Admitting: Physical Therapy

## 2023-01-19 DIAGNOSIS — R262 Difficulty in walking, not elsewhere classified: Secondary | ICD-10-CM

## 2023-01-19 DIAGNOSIS — M5459 Other low back pain: Secondary | ICD-10-CM | POA: Diagnosis not present

## 2023-01-19 DIAGNOSIS — M6281 Muscle weakness (generalized): Secondary | ICD-10-CM | POA: Diagnosis not present

## 2023-01-19 NOTE — Therapy (Signed)
OUTPATIENT PHYSICAL THERAPY TREATMENT   Patient Name: Stephanie [name] MRN: [id number] DOB:[birth year], 37 y.o., female Today's Date: 01/19/2023  END OF SESSION:  PT End of Session - 01/19/23 0938     Visit Number 4    Number of Visits 20    Date for PT Re-Evaluation 02/26/23    Authorization Type [employer] UMR 30 PT visits no copay    PT Start Time 0932    PT Stop Time 1010    PT Time Calculation (min) 38 min    Activity Tolerance Patient tolerated treatment well    Behavior During Therapy WFL for tasks assessed/performed               Past Medical History:  Diagnosis Date   Anemia    Past Surgical History:  Procedure Laterality Date   NO PAST SURGERIES     Patient Active Problem List   Diagnosis Date Noted   Low back pain 11/27/2022   SVD (spontaneous vaginal delivery) 03/04/2016   Group beta Strep positive 03/04/2016   Obstetric labial laceration, delivered, current hospitalization 03/04/2016   Term pregnancy 03/03/2016   Non-reassuring electronic fetal monitoring tracing 03/02/2016    PCP: None listed in epic   REFERRING PROVIDER: [facility], [place], [place]  REFERRING DIAG: M54.50 (ICD-10-CM) - Acute midline low back pain, unspecified whether sciatica present  Rationale for Evaluation and Treatment: Rehabilitation  THERAPY DIAG:  Other low back pain  Muscle weakness (generalized)  Difficulty in walking, not elsewhere classified  ONSET DATE: Sept 23, 2024  SUBJECTIVE:                                                                                                                                                                                           SUBJECTIVE STATEMENT: Pt reporting 5/10 pain in low back.still, relays she does her exercises  PERTINENT HISTORY:  History of anemia  PAIN:  NPRS scale: current 5/10, Pain location: back  Pain description: constant, sharp at times.  Aggravating factors: prolonged standing/walking.   Relieving  factors: medicine  PRECAUTIONS: None  WEIGHT BEARING RESTRICTIONS: No  FALLS:  Has patient fallen in last 6 months? Yes 1  LIVING ENVIRONMENT:  Lives in: House/apartment  Steps required at work.  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   PLOF: Independent, has yard work.    PATIENT GOALS: Reduce pain  OBJECTIVE:   PATIENT SURVEYS:  12/18/2022 FOTO eval:  56  predicted:  70  01/19/23 FOTO 71%  SCREENING FOR RED FLAGS: 12/18/2022 Bowel or bladder incontinence: No Cauda equina syndrome: No  COGNITION: 12/18/2022 Overall cognitive status: WFL normal     POSTURE:  12/18/2022 Mild increase in lumbar lordosis  PALPATION: 12/18/2022 No specific testing today  LUMBAR ROM:  12/18/2022:  no centralization/peripheralization noted in lumbar movements.   AROM 12/18/2022 01/19/23  Flexion To mid shin with painful arc up return To lower shin  Extension 100% c ERP  Repeated x 3 in standing:  continued ERP noted  WNL but pain at end range  Right lateral flexion To head of fibula no complaints 2 inch past head of fibula  Left lateral flexion To head of fibula no complaints 2 inch past head of fibula  Right rotation    Left rotation     (Blank rows = not tested)  LOWER EXTREMITY ROM:      Right 12/18/2022 Left 12/18/2022  Hip flexion    Hip extension    Hip abduction    Hip adduction    Hip internal rotation    Hip external rotation    Knee flexion    Knee extension    Ankle dorsiflexion    Ankle plantarflexion    Ankle inversion    Ankle eversion     (Blank rows = not tested)  LOWER EXTREMITY MMT:    MMT Right 12/18/2022 Left 12/18/2022 Right 01/19/23 Left 01/19/23  Hip flexion 5/5 4+/5 5 5   Hip extension      Hip abduction      Hip adduction      Hip internal rotation      Hip external rotation      Knee flexion 5/5 4/5 5 4+  Knee extension 5/5 4/5 5 5   Ankle dorsiflexion 5/5 5/5    Ankle  plantarflexion            Lumbar flexion supine bilateral SLR  3/5     (Blank rows = not tested)  LUMBAR SPECIAL TESTS:  12/18/2022 Slump test: Negative bilaterally   FUNCTIONAL TESTS:  12/18/2022 None tested   GAIT: 12/18/2022 Independent ambulation                                                                                                                                                                       TODAY'S TREATMENT:  01/19/23:  TherEx:  Nustep: Leve 5 x 6 minutes, UE/LE Supine SKTC stretch 30 sec X 3 Supine posterior pelvic tilts 5 sec X 10 Supine bridge 5 sec X 15 Supine trunk rotation: x 5 bil sides holindg 10 sec Supine dead bug: 2 x 10  Cat camel: x 5 holding 10 sec Childs pose 30 sec X 3 Prone hip extensions X 10 Prone press ups: X10  Updated measurements and FOTO score, see above for details  12/31/22:  TherEx:  Nustep: Leve 5 x 5 minutes, UE/LE Supine bridges c PPT holding 5 sec Supine bridge c feet on red physio-ball x 10 holding 5 sec Supine trunk rotation: x 5 bil sides holindg 10 sec Supine dead bug: 2 x 10  Piriformis stretch: x 3 bil holding 20 sec glute stretch:  x 2 bil holding 20 sec Cat camel: x 5 holding 10 sec Prone press ups: x 3 holding 10 sec Manual:  Lumbar mobs: PA grade 2-3 L2-L4 Percussion to lumbar paraspinals     [facility] Adult PT Treatment:                                                DATE: 12/23/22 Therapeutic Exercise: Supine  LTR 5x10 sec DKTC 2x30 sec Piriformis stretch 2x 30 sec R&L PPT 10x3 sec Bridge 2x10x3 sec Swiss ball press down 10x5 sec Swiss ball press down (alternate arm & leg presses) 10x5 sec Quadruped Child's pose x 30 sec Bird dog 2x10 Sitting Multifidi iso   TODAY'S TREATMENT:                                                                                                         DATE: 12/18/2022  Therex:    HEP instruction/performance c cues for techniques, handout provided.  Trial set  performed of each for comprehension and symptom assessment.  See below for exercise list  PATIENT EDUCATION:  12/18/2022 Education details: HEP, POC Person educated: Patient Education method: Explanation, Demonstration, Verbal cues, and Handouts Education comprehension: verbalized understanding, returned demonstration, and verbal cues required  HOME EXERCISE PROGRAM: Access Code: [email or web address] URL: [email or web address] Date: 12/18/2022 Prepared by: Chyrel Masson  Exercises - Supine Lower Trunk Rotation  - 2-3 x daily - 7 x weekly - 1 sets - 3-5 reps - 15 hold - Supine Bridge  - 1-2 x daily - 7 x weekly - 1-2 sets - 10 reps - 2 hold - Seated Multifidi Isometric  - 2-3 x daily - 7 x weekly - 1 sets - 10 reps - 5-10 hold - Seated Abdominal Press into [employer]  - 2-3 x daily - 7 x weekly - 1 sets - 10 reps - 5-10 hold - Seated Scapular Retraction  - 2-3 x daily - 7 x weekly - 1 sets - 10 reps - 3-5 hold  ASSESSMENT:  CLINICAL IMPRESSION: 01/19/2023: She showed some improvements in lumbar ROM today and overall general leg strength but still with some limitations in these as well as pain. She did however reach her goal on FOTO functional survey. Continue skilled PT interventions.    From eval: Patient is a 37 y.o. who comes to clinic with complaints of back pain with mobility, strength and movement coordination deficits that impair their ability to perform usual daily and recreational functional activities without increase difficulty/symptoms at this time.  Patient to benefit from skilled PT services to address impairments and limitations to improve to  previous level of function without restriction secondary to condition.    GOALS: Goals reviewed with patient? Yes  SHORT TERM GOALS: (target date for Short term goals are 3 weeks 01/08/2023)  1. Patient will demonstrate independent use of home exercise program to maintain progress from in clinic treatments.  Goal status: MET  01/19/23  LONG TERM GOALS: (target dates for all long term goals are 10 weeks  02/26/2023 )   1. Patient will demonstrate/report pain at worst less than or equal to 2/10 to facilitate minimal limitation in daily activity secondary to pain symptoms.  Goal status: ongoing 01/19/23   2. Patient will demonstrate independent use of home exercise program to facilitate ability to maintain/progress functional gains from skilled physical therapy services.  Goal status: ongoing 01/19/23   3. Patient will demonstrate FOTO outcome > or = 70 % to indicate reduced disability due to condition.  Goal status: MET 01/19/23   4. Patient will demonstrate lumbar extension 100 % WFL s symptoms to facilitate upright standing, walking posture at PLOF s limitation.  Goal status:ongoing 01/19/23   5.  Patient will demonstrate/report ability to ascend/descend stairs reciprocally for work environments symptoms/limitations.   Goal status:ongoing 01/19/23   6.  Patient will demonstrate Lt MMT equal to Rt, lumbar flexion > or = 4/5 to facilitate usual daily movement at PLOF.  Goal status: ongoing 01/19/23    PLAN:  PT FREQUENCY: 1-2x/week  PT DURATION: 10 weeks  PLANNED INTERVENTIONS: Can include 16109- PT Re-evaluation, 97110-Therapeutic exercises, 97530- Therapeutic activity, 97112- Neuromuscular re-education, 97535- Self Care, 97140- Manual therapy, [phone]- Gait training, [phone]- Orthotic Fit/training, [phone]- Canalith repositioning, [id number]- Aquatic Therapy, 97014- Electrical stimulation (unattended), [id number]- Electrical stimulation (manual), [id number]- Vasopneumatic device, [id number]- Ultrasound, [id number]- Traction (mechanical), [id number]- Ionotophoresis 4mg /ml Dexamethasone, Patient/Family education, Balance training, Stair training, Taping, Dry Needling, Joint mobilization, Joint manipulation, Spinal manipulation, Spinal mobilization, Scar mobilization, Vestibular training, Visual/preceptual remediation/compensation, DME  instructions, Cryotherapy, and Moist heat.  All performed as medically necessary.  All included unless contraindicated  PLAN FOR NEXT SESSION: Improve ROM and functional LE strength for stairs/squats, core stabilizations.   April Manson, PT, DPT 01/19/23  10:30 AM

## 2023-01-19 NOTE — Telephone Encounter (Signed)
Called after 15 mins no show for appointment.  Pt indicated she was on her way and stuck in traffic and would still come in.  Will plan on discussing whether to do a short appointment or reschedule later.   Chyrel Masson, PT, DPT, OCS, ATC 01/19/23  8:21 AM  w
